# Patient Record
Sex: Male | Born: 1970
Health system: Southern US, Community
[De-identification: ages and names within clinical notes are randomized; demographics above are authoritative.]

## PROBLEM LIST (undated history)

## (undated) DIAGNOSIS — K219 Gastro-esophageal reflux disease without esophagitis: Secondary | ICD-10-CM

## (undated) DIAGNOSIS — R011 Cardiac murmur, unspecified: Secondary | ICD-10-CM

## (undated) DIAGNOSIS — F1020 Alcohol dependence, uncomplicated: Secondary | ICD-10-CM

## (undated) DIAGNOSIS — T7840XA Allergy, unspecified, initial encounter: Secondary | ICD-10-CM

## (undated) HISTORY — DX: Cardiac murmur, unspecified: R01.1

## (undated) HISTORY — DX: Alcohol dependence, uncomplicated: F10.20

## (undated) HISTORY — DX: Allergy, unspecified, initial encounter: T78.40XA

## (undated) HISTORY — DX: Gastro-esophageal reflux disease without esophagitis: K21.9

---

## 2011-11-11 ENCOUNTER — Ambulatory Visit: Payer: Worker's Compensation

## 2011-11-13 ENCOUNTER — Ambulatory Visit: Payer: Worker's Compensation

## 2013-12-05 ENCOUNTER — Ambulatory Visit: Payer: Self-pay

## 2013-12-05 ENCOUNTER — Ambulatory Visit: Payer: Self-pay | Admitting: Family Medicine

## 2013-12-05 VITALS — BP 110/70 | HR 74 | Temp 97.6°F | Resp 16 | Ht 70.0 in | Wt 212.0 lb

## 2013-12-05 DIAGNOSIS — M549 Dorsalgia, unspecified: Secondary | ICD-10-CM

## 2013-12-05 DIAGNOSIS — F17200 Nicotine dependence, unspecified, uncomplicated: Secondary | ICD-10-CM

## 2013-12-05 DIAGNOSIS — IMO0002 Reserved for concepts with insufficient information to code with codable children: Secondary | ICD-10-CM

## 2013-12-05 DIAGNOSIS — Z720 Tobacco use: Secondary | ICD-10-CM

## 2013-12-05 MED ORDER — VARENICLINE TARTRATE 0.5 MG X 11 & 1 MG X 42 PO MISC: ORAL | Status: DC

## 2013-12-05 MED ORDER — MELOXICAM 15 MG PO TABS: 15.0000 mg | ORAL_TABLET | Freq: Every day | ORAL | Status: DC

## 2013-12-05 MED ORDER — VARENICLINE TARTRATE 1 MG PO TABS: 1.0000 mg | ORAL_TABLET | Freq: Two times a day (BID) | ORAL | Status: DC

## 2013-12-05 MED ORDER — CYCLOBENZAPRINE HCL 10 MG PO TABS: ORAL_TABLET | ORAL | Status: DC

## 2013-12-05 MED ORDER — OXYCODONE-ACETAMINOPHEN 5-325 MG PO TABS: 1.0000 | ORAL_TABLET | Freq: Three times a day (TID) | ORAL | Status: DC | PRN

## 2013-12-05 NOTE — Progress Notes (Signed)
Chief Complaint:  Chief Complaint  Patient presents with  . Back Pain    x 3 days    HPI: Larry Taylor is a 43 y.o. male who is here for  Back pain x 3 days.  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 He has had back pain in the past. His original back pain started years ago when he had back pain after moving a refrigerator.  This particular episode may have started about 1 week ago at work after he picked up a bucket. He thought it was better but in the last 3 days he has had worsening sxs. He  got out of bathtub and bent over to brush his teeth and the pain started.  2 weeks prior to the lifting of the bucket at work he was fine, did not have back pain. He had been helping his mom, lifting her and cleaning her and his back was fine, She passed but he states that he did not have back pain when he was helping her.   His back pain is described as a constant aching pain, any movement produces sharp pain radiating down his mid back and then he would have aching down his leg but shooting pain goes up to back as well. He has to be in a bent position or his back has to be flexed to get some relief. 10/10 pain , worse back pain he has ever had. He took goody powder, back and body, ibuprofen and bayer without relief. He denies numbness,tingling, incontinence. He has some groin pain abut he is able to go to the bathroom normally. He does not have insurance through his work, they cut benefits after [employer] came into play but he never had it before either, he is thinking of going on his wife's insurance. He does not currently want to file this under work comp because the facility has [employer] work comp cases in the last few weeks.    He would liek help with his tobacco addition. He lost both parents to lung cancer.  Has tried before but not successful. He has been smoking 1 ppd x 27 years.   History reviewed. No pertinent past medical  history. History reviewed. No pertinent past surgical history. History   Social History  . Marital Status: Single    Spouse Name: N/A    Number of Children: N/A  . Years of Education: N/A   Social History Main Topics  . Smoking status: Current Every Day Smoker  . Smokeless tobacco: None  . Alcohol Use: None  . Drug Use: None  . Sexual Activity: None   Other Topics Concern  . None   Social History Narrative  . None   Family History  Problem Relation Age of Onset  . Cancer Mother   . Cancer Father   . Heart disease Father   . Stroke Father   . High Cholesterol Father   . Cancer Sister   . Cancer Brother    No Known Allergies Prior to Admission medications   Not on File     ROS: The patient denies fevers, chills, night sweats, unintentional weight loss, chest pain, palpitations, wheezing, dyspnea on exertion, nausea, vomiting, abdominal pain, dysuria, hematuria, melena, numbness, weakness, or tingling.   All other systems have been reviewed and were otherwise negative with the exception of those mentioned in the HPI and as above.  PHYSICAL EXAM: Filed Vitals:   12/05/13 1152  BP: 110/70  Pulse: 74  Temp: 97.6 F (36.4 C)  Resp: 16   Filed Vitals:   12/05/13 1152  Height: 5\' 10"  (1.778 m)  Weight: 212 lb (96.163 kg)   Body mass index is 30.42 kg/(m^2).  General: Alert, no acute distress HEENT:  Normocephalic, atraumatic, oropharynx patent. EOMI, PERRLA Cardiovascular:  Regular rate and rhythm, no rubs murmurs or gallops.  No Carotid bruits, radial pulse intact. No pedal edema.  Respiratory: Clear to auscultation bilaterally.  No wheezes, rales, or rhonchi.  No cyanosis, no use of accessory musculature GI: No organomegaly, abdomen is soft and non-tender, positive bowel sounds.  No masses. Skin: No rashes. Neurologic: Facial musculature symmetric. Psychiatric: Patient is appropriate throughout our interaction. Lymphatic: No cervical  lymphadenopathy Musculoskeletal: Gait intact. + paramsk tenderness  Decrease ROM d/t pain 5/5 strength, 2/2 DTRs No saddle anesthesia Straight leg positive Hip and knee exam--normal    LABS: No results found for this or any previous visit.   EKG/XRAY:   [facility]. No fx or dislocation   ASSESSMENT/PLAN: Encounter Diagnoses  Name Primary?  . Back pain Yes  . Sprain and strain of back   . Tobacco abuse    F/u on Thursday Rx flexeril, mobic, percocet, ROM exercises, modify activities. Narcotic profile pulled-no e/o abuse.  Rx chantix  Starter and refills for 3 months  Gross sideeffects, risk and benefits, and alternatives of medications d/w patient. Patient is aware that all medications have potential sideeffects and we are unable to predict every sideeffect or drug-drug interaction that may occur.  Dearion Huot PHUONG, DO 12/05/2013 1:19 PM

## 2013-12-05 NOTE — Patient Instructions (Signed)
Back Pain, Adult Low back pain is very common. About 1 in 5 people have back pain.The cause of low back pain is rarely dangerous. The pain often gets better over time.About half of people with a sudden onset of back pain feel better in just 2 weeks. About 8 in 10 people feel better by 6 weeks.  CAUSES Some common causes of back pain include:  Strain of the muscles or ligaments supporting the spine.  Wear and tear (degeneration) of the spinal discs.  Arthritis.  Direct injury to the back. DIAGNOSIS Most of the time, the direct cause of low back pain is not known.However, back pain can be treated effectively even when the exact cause of the pain is unknown.Answering your caregiver's questions about your overall health and symptoms is one of the most accurate ways to make sure the cause of your pain is not dangerous. If your caregiver needs more information, he or she may order lab work or imaging tests (X-rays or MRIs).However, even if imaging tests show changes in your back, this usually does not require surgery. HOME CARE INSTRUCTIONS For many people, back pain returns.Since low back pain is rarely dangerous, it is often a condition that people can learn to manageon their own.   Remain active. It is stressful on the back to sit or stand in one place. Do not sit, drive, or stand in one place for more than 30 minutes at a time. Take short walks on level surfaces as soon as pain allows.Try to increase the length of time you walk each day.  Do not stay in bed.Resting more than 1 or 2 days can delay your recovery.  Do not avoid exercise or work.Your body is made to move.It is not dangerous to be active, even though your back may hurt.Your back will likely heal faster if you return to being active before your pain is gone.  Pay attention to your body when you bend and lift. Many people have less discomfortwhen lifting if they bend their knees, keep the load close to their bodies,and  avoid twisting. Often, the most comfortable positions are those that put less stress on your recovering back.  Find a comfortable position to sleep. Use a firm mattress and lie on your side with your knees slightly bent. If you lie on your back, put a pillow under your knees.  Only take over-the-counter or prescription medicines as directed by your caregiver. Over-the-counter medicines to reduce pain and inflammation are often the most helpful.Your caregiver may prescribe muscle relaxant drugs.These medicines help dull your pain so you can more quickly return to your normal activities and healthy exercise.  Put ice on the injured area.  Put ice in a plastic bag.  Place a towel between your skin and the bag.  Leave the ice on for 15-20 minutes, 03-04 times a day for the first 2 to 3 days. After that, ice and heat may be alternated to reduce pain and spasms.  Ask your caregiver about trying back exercises and gentle massage. This may be of some benefit.  Avoid feeling anxious or stressed.Stress increases muscle tension and can worsen back pain.It is important to recognize when you are anxious or stressed and learn ways to manage it.Exercise is a great option. SEEK MEDICAL CARE IF:  You have pain that is not relieved with rest or medicine.  You have pain that does not improve in 1 week.  You have new symptoms.  You are generally not feeling well. SEEK   IMMEDIATE MEDICAL CARE IF:   You have pain that radiates from your back into your legs.  You develop new bowel or bladder control problems.  You have unusual weakness or numbness in your arms or legs.  You develop nausea or vomiting.  You develop abdominal pain.  You feel faint. Document Released: 10/13/2005 Document Revised: 04/13/2012 Document Reviewed: 03/03/2011 Exit[employer] Patient Information 2014 [employer].  

## 2013-12-08 ENCOUNTER — Ambulatory Visit (INDEPENDENT_AMBULATORY_CARE_PROVIDER_SITE_OTHER): Payer: Self-pay | Admitting: Family Medicine

## 2013-12-08 VITALS — BP 120/78 | HR 83 | Temp 97.6°F | Resp 16 | Ht 68.5 in | Wt 214.0 lb

## 2013-12-08 DIAGNOSIS — M549 Dorsalgia, unspecified: Secondary | ICD-10-CM

## 2013-12-08 NOTE — Progress Notes (Signed)
Chief Complaint:  Chief Complaint  Patient presents with  . Follow-up    Back pain    HPI: Larry Taylor is a 43 y.o. male who is here for  Recheck of his back pain. He is doing better but not 100%. He is 85% better. He still has a little twinge and msk catching when he bends over.  He is able to lift his leg but still has pain. No numbness or tingling. He is able to clean without problems but he is afraid to lift heavy items or objects. HE has not really taken the pain meds, tried the msk relaxer and that made his sleepy so he stopped. HE ahs been taking the mobic and moving around so that helps. HE feels achey when he sits for long periods and then gets up, long periods being 30 in at a time.   History reviewed. No pertinent past medical history. History reviewed. No pertinent past surgical history. History   Social History  . Marital Status: Single    Spouse Name: N/A    Number of Children: N/A  . Years of Education: N/A   Social History Main Topics  . Smoking status: Current Every Day Smoker  . Smokeless tobacco: None  . Alcohol Use: None  . Drug Use: None  . Sexual Activity: None   Other Topics Concern  . None   Social History Narrative  . None   Family History  Problem Relation Age of Onset  . Cancer Mother   . Cancer Father   . Heart disease Father   . Stroke Father   . High Cholesterol Father   . Cancer Sister   . Cancer Brother    No Known Allergies Prior to Admission medications   Medication Sig Start Date End Date Taking? Authorizing Provider  cyclobenzaprine (FLEXERIL) 10 MG tablet 1/2 to 1 tab po twice a day as needed for msk spasms. Do not drive,makes you  drowsy 1/6/102/9/15  Yes Vickye Astorino P Makeisha Jentsch, DO  meloxicam (MOBIC) 15 MG tablet Take 1 tablet (15 mg total) by mouth daily. No NSAIDs, take with food. 12/05/13  Yes Maythe Deramo P Maizey Menendez, DO  oxyCODONE-acetaminophen (ROXICET) 5-325 MG per tablet Take 1 tablet by mouth every 8 (eight) hours as needed for severe pain.  12/05/13  Yes Mathew Storck P Briana Farner, DO  varenicline (CHANTIX STARTING MONTH PAK) 0.5 MG X 11 & 1 MG X 42 tablet Take one 0.5 mg tablet by mouth once daily for 3 days, then increase to one 0.5 mg tablet twice daily for 4 days, then increase to one 1 mg tablet twice daily. 12/05/13  Yes Ayslin Kundert P Caitlynn Ju, DO  varenicline (CHANTIX) 1 MG tablet Take 1 tablet (1 mg total) by mouth 2 (two) times daily. 12/05/13  Yes Verland Sprinkle P Ebbie Cherry, DO     ROS: The patient denies fevers, chills, night sweats, unintentional weight loss, chest pain, palpitations, wheezing, dyspnea on exertion, nausea, vomiting, abdominal pain, dysuria, hematuria, melena, numbness, weakness, or tingling.  All other systems have been reviewed and were otherwise negative with the exception of those mentioned in the HPI and as above.    PHYSICAL EXAM: Filed Vitals:   12/08/13 1937  BP: 120/78  Pulse: 83  Temp: 97.6 F (36.4 C)  Resp: 16   Filed Vitals:   12/08/13 1937  Height: 5' 8.5" (1.74 m)  Weight: 214 lb (97.07 kg)   Body mass index is 32.06 kg/(m^2).  General: Alert, no acute distress  HEENT:  Normocephalic, atraumatic, oropharynx patent. EOMI, PERRLA Cardiovascular:  Regular rate and rhythm, no rubs murmurs or gallops.  No Carotid bruits, radial pulse intact. No pedal edema.  Respiratory: Clear to auscultation bilaterally.  No wheezes, rales, or rhonchi.  No cyanosis, no use of accessory musculature GI: No organomegaly, abdomen is soft and non-tender, positive bowel sounds.  No masses. Skin: No rashes. Neurologic: Facial musculature symmetric. Psychiatric: Patient is appropriate throughout our interaction. Lymphatic: No cervical lymphadenopathy Musculoskeletal: Gait intact.   LABS: No results found for this or any previous visit.   EKG/XRAY:   Primary read interpreted by Dr. Conley Rolls at [facility].   ASSESSMENT/PLAN: Encounter Diagnosis  Name Primary?  . Back pain Yes   Improved Return to work on Monday Continue with Mobic, may take other meds  at 1/2 dose prn F/u as needed  Gross sideeffects, risk and benefits, and alternatives of medications d/w patient. Patient is aware that all medications have potential sideeffects and we are unable to predict every sideeffect or drug-drug interaction that may occur.  Hamilton Capri PHUONG, DO 12/08/2013 8:05 PM

## 2014-01-06 ENCOUNTER — Encounter: Payer: Self-pay | Admitting: Family Medicine

## 2014-01-06 ENCOUNTER — Ambulatory Visit: Payer: Self-pay | Admitting: Family Medicine

## 2014-01-06 VITALS — BP 121/78 | HR 68 | Temp 98.5°F | Resp 16 | Ht 69.5 in | Wt 210.0 lb

## 2014-01-06 DIAGNOSIS — Z13220 Encounter for screening for lipoid disorders: Secondary | ICD-10-CM

## 2014-01-06 DIAGNOSIS — Z131 Encounter for screening for diabetes mellitus: Secondary | ICD-10-CM

## 2014-01-06 DIAGNOSIS — Z720 Tobacco use: Secondary | ICD-10-CM

## 2014-01-06 DIAGNOSIS — F17200 Nicotine dependence, unspecified, uncomplicated: Secondary | ICD-10-CM

## 2014-01-06 LAB — LIPID PANEL
Cholesterol: 228 mg/dL — ABNORMAL HIGH (ref 0–200)
HDL: 42 mg/dL (ref >39)
LDL Cholesterol: 168 mg/dL — ABNORMAL HIGH (ref 0–99)
Total CHOL/HDL Ratio: 5.4 Ratio
Triglycerides: 89 mg/dL (ref <150)
VLDL: 18 mg/dL (ref 0–40)

## 2014-01-06 LAB — COMPREHENSIVE METABOLIC PANEL
ALT: 49 U/L (ref 0–53)
Albumin: 4.5 g/dL (ref 3.5–5.2)
BUN: 12 mg/dL (ref 6–23)
CO2: 27 mEq/L (ref 19–32)
Calcium: 10.3 mg/dL (ref 8.4–10.5)
Chloride: 101 mEq/L (ref 96–112)
Creat: 1.06 mg/dL (ref 0.50–1.35)
Potassium: 4.5 mEq/L (ref 3.5–5.3)

## 2014-01-06 LAB — CBC
HCT: 48.2 % (ref 39.0–52.0)
Hemoglobin: 16.9 g/dL (ref 13.0–17.0)
MCH: 31 pg (ref 26.0–34.0)
MCHC: 35.1 g/dL (ref 30.0–36.0)
MCV: 88.3 fL (ref 78.0–100.0)
Platelets: 239 10*3/uL (ref 150–400)
RBC: 5.46 MIL/uL (ref 4.22–5.81)
RDW: 14 % (ref 11.5–15.5)
WBC: 8.7 10*3/uL (ref 4.0–10.5)

## 2014-01-06 LAB — COMPREHENSIVE METABOLIC PANEL WITH GFR
AST: 29 U/L (ref 0–37)
Alkaline Phosphatase: 76 U/L (ref 39–117)
Glucose, Bld: 82 mg/dL (ref 70–99)
Sodium: 137 meq/L (ref 135–145)
Total Bilirubin: 0.5 mg/dL (ref 0.2–1.2)
Total Protein: 7.2 g/dL (ref 6.0–8.3)

## 2014-01-06 NOTE — Progress Notes (Signed)
Chief Complaint:  Chief Complaint  Patient presents with  . Annual Exam    HPI: Larry Taylor is a 43 y.o. male who is here for  Annual visit. He is doing well. He has no complaints.  Would like to get screening hyperlipidemia. He has a chronic smoker's cough.  He has not picked up Chantix. Strong family history of lung cancer, he is a smoker.  His back pain is better.    Past Medical History  Diagnosis Date  . Heart murmur     per patient diagnosed at 43 years old   No past surgical history on file. History   Social History  . Marital Status: Single    Spouse Name: N/A    Number of Children: N/A  . Years of Education: N/A   Occupational History  . 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 Smoking status: Current Every Day Smoker -- 1.00 packs/day for 27 years    Types: Cigarettes  . Smokeless tobacco: None  . Alcohol Use: None  . Drug Use: None  . Sexual Activity: None   Other Topics Concern  . None   Social History Narrative  . None   Family History  Problem Relation Age of Onset  . Cancer Mother   . Hyperlipidemia Mother   . Cancer Father   . Heart disease Father   . Stroke Father   . High Cholesterol Father   . Cancer Sister   . Cancer Brother    No Known Allergies Prior to Admission medications   Medication Sig Start Date End Date Taking? Authorizing Provider  cyclobenzaprine (FLEXERIL) 10 MG tablet 1/2 to 1 tab po twice a day as needed for msk spasms. Do not drive,makes you  drowsy 1/6/102/9/15   Denicia Pagliarulo P Kerrin Markman, DO  meloxicam (MOBIC) 15 MG tablet Take 1 tablet (15 mg total) by mouth daily. No NSAIDs, take with food. 12/05/13   Corrin Sieling P Kauan Kloosterman, DO  oxyCODONE-acetaminophen (ROXICET) 5-325 MG per tablet Take 1 tablet by mouth every 8 (eight) hours as needed for severe pain. 12/05/13   Shalamar Plourde P Verland Sprinkle, DO  varenicline (CHANTIX STARTING MONTH PAK) 0.5 MG X 11 & 1 MG X 42 tablet Take one 0.5 mg tablet by mouth once daily for 3 days, then increase  to one 0.5 mg tablet twice daily for 4 days, then increase to one 1 mg tablet twice daily. 12/05/13   Azavier Creson P Wolfgang Finigan, DO  varenicline (CHANTIX) 1 MG tablet Take 1 tablet (1 mg total) by mouth 2 (two) times daily. 12/05/13   Kili Gracy P Bayyinah Dukeman, DO     ROS: The patient denies fevers, chills, night sweats, unintentional weight loss, chest pain, palpitations, wheezing, dyspnea on exertion, nausea, vomiting, abdominal pain, dysuria, hematuria, melena, numbness, weakness, or tingling.   All other systems have been reviewed and were otherwise negative with the exception of those mentioned in the HPI and as above.    PHYSICAL EXAM: Filed Vitals:   01/06/14 1539  BP: 121/78  Pulse: 68  Temp: 98.5 F (36.9 C)  Resp: 16   Filed Vitals:   01/06/14 1539  Height: 5' 9.5" (1.765 m)  Weight: 210 lb (95.255 kg)   Body mass index is 30.58 kg/(m^2).  General: Alert, no acute distress HEENT:  Normocephalic, atraumatic, oropharynx patent. EOMI, PERRLA Cardiovascular:  Regular rate and rhythm, no rubs murmurs or gallops.  No Carotid bruits, radial pulse intact. No pedal edema.  Respiratory: Clear to auscultation bilaterally.  No wheezes, rales, or rhonchi.  No cyanosis, no use of accessory musculature GI: No organomegaly, abdomen is soft and non-tender, positive bowel sounds.  No masses. Skin: No rashes. Neurologic: Facial musculature symmetric. Psychiatric: Patient is appropriate throughout our interaction. Lymphatic: No cervical lymphadenopathy Musculoskeletal: Gait intact. Normal genital exam, normal hernia exam   LABS: Results for orders placed in visit on 01/06/14  CBC      Result Value Ref Range   WBC 8.7  4.0 - 10.5 K/uL   RBC 5.46  4.22 - 5.81 MIL/uL   Hemoglobin 16.9  13.0 - 17.0 g/dL   HCT 16.1  09.6 - 04.5 %   MCV 88.3  78.0 - 100.0 fL   MCH 31.0  26.0 - 34.0 pg   MCHC 35.1  30.0 - 36.0 g/dL   RDW 40.9  81.1 - 91.4 %   Platelets 239  150 - 400 K/uL  COMPREHENSIVE METABOLIC PANEL      Result  Value Ref Range   Sodium 137  135 - 145 mEq/L   Potassium 4.5  3.5 - 5.3 mEq/L   Chloride 101  96 - 112 mEq/L   CO2 27  19 - 32 mEq/L   Glucose, Bld 82  70 - 99 mg/dL   BUN 12  6 - 23 mg/dL   Creat 7.82  9.56 - 2.13 mg/dL   Total Bilirubin 0.5  0.2 - 1.2 mg/dL   Alkaline Phosphatase 76  39 - 117 U/L   AST 29  0 - 37 U/L   ALT 49  0 - 53 U/L   Total Protein 7.2  6.0 - 8.3 g/dL   Albumin 4.5  3.5 - 5.2 g/dL   Calcium 08.6  8.4 - 57.8 mg/dL  LIPID PANEL      Result Value Ref Range   Cholesterol 228 (*) 0 - 200 mg/dL   Triglycerides 89  <469 mg/dL   HDL 42  >62 mg/dL   Total CHOL/HDL Ratio 5.4     VLDL 18  0 - 40 mg/dL   LDL Cholesterol 952 (*) 0 - 99 mg/dL     EKG/XRAY:   Primary read interpreted by Dr. Conley Rolls at [facility].   ASSESSMENT/PLAN: Encounter Diagnoses  Name Primary?  . Screening for diabetes mellitus Yes  . Screening for hyperlipidemia   . Tobacco use    Labs pending: CMP, CBC, lipid F/u as needed  Gross sideeffects, risk and benefits, and alternatives of medications d/w patient. Patient is aware that all medications have potential sideeffects and we are unable to predict every sideeffect or drug-drug interaction that may occur.  Hamilton Capri PHUONG, DO 01/11/2014 8:47 AM

## 2014-01-11 ENCOUNTER — Encounter: Payer: Self-pay | Admitting: Family Medicine

## 2014-01-19 ENCOUNTER — Telehealth: Payer: Self-pay

## 2014-01-19 NOTE — Telephone Encounter (Signed)
Patient called stated Larry Taylor seen him for his physical two weeks ago and stated he requested Larry Taylor to be his primary care doctor. [name] stated he also will like for Larry Taylor to be his wife primary care doctor also.

## 2014-02-06 ENCOUNTER — Encounter: Payer: Self-pay | Admitting: Family Medicine

## 2014-10-10 ENCOUNTER — Encounter: Payer: Self-pay | Admitting: Family Medicine

## 2016-09-01 ENCOUNTER — Ambulatory Visit (INDEPENDENT_AMBULATORY_CARE_PROVIDER_SITE_OTHER): Payer: Managed Care, Other (non HMO) | Admitting: Physician Assistant

## 2016-09-01 VITALS — BP 124/80 | HR 65 | Temp 98.2°F | Resp 17 | Ht 69.5 in | Wt 216.0 lb

## 2016-09-01 DIAGNOSIS — R6882 Decreased libido: Secondary | ICD-10-CM

## 2016-09-01 DIAGNOSIS — K649 Unspecified hemorrhoids: Secondary | ICD-10-CM | POA: Diagnosis not present

## 2016-09-01 DIAGNOSIS — Z0000 Encounter for general adult medical examination without abnormal findings: Secondary | ICD-10-CM

## 2016-09-01 DIAGNOSIS — F17200 Nicotine dependence, unspecified, uncomplicated: Secondary | ICD-10-CM | POA: Diagnosis not present

## 2016-09-01 DIAGNOSIS — Z716 Tobacco abuse counseling: Secondary | ICD-10-CM

## 2016-09-01 DIAGNOSIS — Z130 Encounter for screening for diseases of the blood and blood-forming organs and certain disorders involving the immune mechanism: Secondary | ICD-10-CM | POA: Diagnosis not present

## 2016-09-01 DIAGNOSIS — Z13228 Encounter for screening for other metabolic disorders: Secondary | ICD-10-CM

## 2016-09-01 DIAGNOSIS — R5383 Other fatigue: Secondary | ICD-10-CM | POA: Diagnosis not present

## 2016-09-01 DIAGNOSIS — R9431 Abnormal electrocardiogram [ECG] [EKG]: Secondary | ICD-10-CM | POA: Diagnosis not present

## 2016-09-01 DIAGNOSIS — Z720 Tobacco use: Secondary | ICD-10-CM

## 2016-09-01 DIAGNOSIS — Z13220 Encounter for screening for lipoid disorders: Secondary | ICD-10-CM

## 2016-09-01 LAB — CBC WITH DIFFERENTIAL/PLATELET
BASOS ABS: 0 {cells}/uL (ref 0–200)
Basophils Relative: 0 %
EOS ABS: 105 {cells}/uL (ref 15–500)
Eosinophils Relative: 1 %
HEMATOCRIT: 49.8 % (ref 38.5–50.0)
Hemoglobin: 17.1 g/dL (ref 13.2–17.1)
LYMPHS PCT: 27 %
Lymphs Abs: 2835 cells/uL (ref 850–3900)
MCH: 31.5 pg (ref 27.0–33.0)
MCHC: 34.3 g/dL (ref 32.0–36.0)
MCV: 91.7 fL (ref 80.0–100.0)
MONO ABS: 525 {cells}/uL (ref 200–950)
MPV: 10.8 fL (ref 7.5–12.5)
Monocytes Relative: 5 %
Neutro Abs: 7035 cells/uL (ref 1500–7800)
Neutrophils Relative %: 67 %
Platelets: 228 10*3/uL (ref 140–400)
RBC: 5.43 MIL/uL (ref 4.20–5.80)
RDW: 13.4 % (ref 11.0–15.0)
WBC: 10.5 10*3/uL (ref 3.8–10.8)

## 2016-09-01 LAB — LIPID PANEL
CHOLESTEROL: 212 mg/dL — AB (ref <200)
HDL: 45 mg/dL (ref >40)
LDL Cholesterol: 151 mg/dL — ABNORMAL HIGH
TRIGLYCERIDES: 78 mg/dL (ref <150)
Total CHOL/HDL Ratio: 4.7 Ratio (ref <5.0)
VLDL: 16 mg/dL (ref <30)

## 2016-09-01 LAB — COMPLETE METABOLIC PANEL WITH GFR
ALT: 30 U/L (ref 9–46)
AST: 22 U/L (ref 10–40)
Albumin: 4.6 g/dL (ref 3.6–5.1)
Alkaline Phosphatase: 64 U/L (ref 40–115)
BUN: 18 mg/dL (ref 7–25)
CALCIUM: 10.1 mg/dL (ref 8.6–10.3)
CO2: 23 mmol/L (ref 20–31)
CREATININE: 0.9 mg/dL (ref 0.60–1.35)
Chloride: 105 mmol/L (ref 98–110)
GFR, Est Non African American: >89 mL/min (ref >=60)
Glucose, Bld: 91 mg/dL (ref 65–99)
Potassium: 4.7 mmol/L (ref 3.5–5.3)
Sodium: 138 mmol/L (ref 135–146)
TOTAL PROTEIN: 7.1 g/dL (ref 6.1–8.1)
Total Bilirubin: 0.4 mg/dL (ref 0.2–1.2)

## 2016-09-01 LAB — TSH: TSH: 1.01 mIU/L (ref 0.40–4.50)

## 2016-09-01 MED ORDER — VARENICLINE TARTRATE 0.5 MG X 11 & 1 MG X 42 PO MISC: ORAL | 0 refills | Status: DC

## 2016-09-01 MED ORDER — VARENICLINE TARTRATE 1 MG PO TABS: 1.0000 mg | ORAL_TABLET | Freq: Two times a day (BID) | ORAL | 2 refills | Status: DC

## 2016-09-01 NOTE — Progress Notes (Signed)
Larry Taylor  MRN: [id number] DOB: 31-Dec-1970  Subjective:  Pt is a 45 y.o. male who presents for annual physical exam.   Diet: He eats whole grain foods and has cut out most sugars. He likes almonds, fish, chicken, pb and j sandwiches. He drinks water and 2% milk.   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 He does not do any structured exercise daily.   His concerns today:  1) Low energy level: Notes that for the past year, he feels more fatigued. He needs at least one nap a day. He gets about 7-8 hours of sleep a night (goes to bed at 10p and gets up 6a), notes he sleeps throughout the entire night. He feels good in the morning but after a few hours he feels as if he needs a nap. He drinks about three cups of coffee in the morning.    2) Wants to discuss getting hemorrhoids removed: He has hemorrhoids for 22 years. In 2005, he had them drained. He has noticed bright red blood in his stool these past two weeks except for yesterday and today. He does not have any pain during bowel movements. He has one bowel movement in the morning and notes he does not have to strain.    3) He would like a prescription for viagra: Pt notes that he has been having erectile dysfunction for the past two years. He is sexaully active with his wife but notes that he is having a hard time becoming aroused. He also has difficulty obtaining and maintaining an erection. He notes he has not had a morning erection in over 1.5 years. He has never tried medication for this issue in the past. He denies any exertional chest pain or family history of MI's. His father did have CHF.   4) Would also like a refill for chantix. He never picked up the prescription in 2015. Pt is an every day smoker but is ready to quit. Both of his parents died from lung cancer.   Last annual exam: 01/06/2014 Last dental exam: 2015 Last vision exam: Never Last EKG: 2011, normal    Vaccinations      Tetanus: 10/10/2014    Patient is fasting today.   There are no active problems to display for this patient.   Current Outpatient Prescriptions on File Prior to Visit  Medication Sig Dispense Refill  . varenicline (CHANTIX STARTING MONTH PAK) 0.5 MG X 11 & 1 MG X 42 tablet Take one 0.5 mg tablet by mouth once daily for 3 days, then increase to one 0.5 mg tablet twice daily for 4 days, then increase to one 1 mg tablet twice daily. (Patient not taking: Reported on 09/01/2016) 53 tablet 0  . varenicline (CHANTIX) 1 MG tablet Take 1 tablet (1 mg total) by mouth 2 (two) times daily. (Patient not taking: Reported on 09/01/2016) 60 tablet 2   No current facility-administered medications on file prior to visit.     No Known Allergies  Social History   Social History  . Marital status: Single    Spouse name: N/A  . Number of children: N/A  . Years of education: N/A   Occupational History  . environmental services director [employer]   Social History Main Topics  . Smoking status: Current Every Day Smoker    Packs/day: 1.00    Years: 27.00    Types: Cigarettes  . Smokeless tobacco: None  .  Alcohol use None  . Drug use: Unknown  . Sexual activity: Not Asked   Other Topics Concern  . None   Social History Narrative  . None    No past surgical history on file.  Family History  Problem Relation Age of Onset  . Cancer Mother   . Hyperlipidemia Mother   . Cancer Father   . Heart disease Father   . Stroke Father   . High Cholesterol Father   . Cancer Sister   . Cancer Brother     Review of Systems  Constitutional: Negative.   HENT: Negative.   Eyes: Negative.   Respiratory: Positive for cough ( especially in the morning, notes he has had this for years due to smoking), shortness of breath and wheezing. Negative for apnea, choking, chest tightness and stridor.   Cardiovascular: Negative.   Gastrointestinal: Positive for anal  bleeding and blood in stool. Negative for abdominal distention, abdominal pain, constipation, diarrhea, nausea, rectal pain and vomiting.  Endocrine: Negative.   Genitourinary: Negative.   Skin: Negative.   Allergic/Immunologic: Negative.   Neurological: Negative.   Hematological: Negative.   Psychiatric/Behavioral: Negative.     Objective:  BP 124/80 (BP Location: Right Arm, Patient Position: Sitting, Cuff Size: Normal)   Pulse 65   Temp 98.2 F (36.8 C) (Oral)   Resp 17   Ht 5' 9.5" (1.765 m)   Wt 216 lb (98 kg)   SpO2 96%   BMI 31.44 kg/m   Physical Exam  Constitutional: He is oriented to person, place, and time and well-developed, well-nourished, and in no distress.  HENT:  Head: Normocephalic and atraumatic.  Right Ear: Hearing, tympanic membrane, external ear and ear canal normal.  Left Ear: Hearing, tympanic membrane, external ear and ear canal normal.  Nose: Septal deviation present.  Mouth/Throat: Uvula is midline, oropharynx is clear and moist and mucous membranes are normal. No oropharyngeal exudate.  Eyes: Conjunctivae and EOM are normal. Pupils are equal, round, and reactive to light.  Neck: Trachea normal and normal range of motion.  Cardiovascular: Normal rate, regular rhythm, normal heart sounds and intact distal pulses.   Pulmonary/Chest: Effort normal and breath sounds normal.  Abdominal: Soft. Normal appearance and bowel sounds are normal.  Genitourinary: Rectal exam shows external hemorrhoid (non tender). Rectal exam shows no tenderness.  Musculoskeletal: Normal range of motion.  Lymphadenopathy:       Head (right side): No submental, no submandibular, no tonsillar, no preauricular, no posterior auricular and no occipital adenopathy present.       Head (left side): No submental, no submandibular, no tonsillar, no preauricular, no posterior auricular and no occipital adenopathy present.    He has no cervical adenopathy.       Right: No supraclavicular  adenopathy present.       Left: No supraclavicular adenopathy present.  Neurological: He is alert and oriented to person, place, and time. He has normal sensation, normal strength and normal reflexes. Gait normal.  Skin: Skin is warm and dry.  Psychiatric: Affect normal.  Vitals reviewed.    Visual Acuity Screening   Right eye Left eye Both eyes  Without correction: 20/20 20/25 20/20   With correction:       EKG shows sinus bradycardia at 51 bpm with inferolateral ST elevation, likely repolarization variant. Findings presented to Dr. Neva SeatGreene. No prior EKG in our system for comparison.  Assessment and Plan :  Discussed healthy lifestyle, diet, exercise, preventative care, vaccinations, and addressed patient's concerns. Plan  for follow up in one year. Otherwise, plan for specific conditions below.  1. Annual physical exam Await results  2. Screening, anemia, deficiency, iron - CBC with Differential/Platelet  3. Screening for metabolic disorder - COMPLETE METABOLIC PANEL WITH GFR  4. Screening, lipid - Lipid panel  5. Other fatigue - Testos,Total,Free and SHBG (Male) - TSH  6. Decreased sex drive - Testos,Total,Free and SHBG (Male)  7. Smoker - EKG 12-Lead  8. Nonspecific abnormal electrocardiogram (ECG) (EKG) -Due to EKG findings will refer to cardiology. Pt is not having chest pain or tightness today however since there is no EKG for comparison and considering he is 45 years old and a smoker, he will benefit from seeing cardiology. Will wait to prescribe viagra until after he has his cardiology appointment. He can call the office at this point and receive a prescription for viagra as long as he is cleared from cardiology.  - Ambulatory referral to Cardiology  9. Hemorrhoids, unspecified hemorrhoid type - Ambulatory referral to Gastroenterology  10. Encounter for smoking cessation counseling -Smoking cessation given.  - varenicline (CHANTIX STARTING MONTH PAK) 0.5 MG  X 11 & 1 MG X 42 tablet; Take one 0.5 mg tablet by mouth once daily for 3 days, then increase to one 0.5 mg tablet twice daily for 4 days, then increase to one 1 mg tablet twice daily.  Dispense: 53 tablet; Refill: 0   Benjiman Core PA-C  [facility] 09/01/2016 9:32 AM

## 2016-09-01 NOTE — Patient Instructions (Addendum)
  CHANTIX Instructions: Select a QUIT date at least 7 days in the future. You should be taking the Chantix for at least 7 days before you stop smoking. Days 1-4: take 1/2 tablet (0.5 mg) each evening Days 5-8: take 1/2 tablet (0.5 mg) each morning and each evening Days 9-12: take 1/2 tablet (0.5 mg) each morning and 1 tablet (1 mg) each evening Days 13-on: Take 1 tablet (1 mg) each morning and each evening Treatment duration is 12 weeks. May repeat again for additional 12 weeks.  If you run out of medication, call our office.  You will be hearing from both GI and cardiology for your future appointments. Thank you for letting me participate in your health and well being.    IF you received an x-ray today, you will receive an invoice from [facility]. Please contact [facility] at [phone] with questions or concerns regarding your invoice.   IF you received labwork today, you will receive an invoice from [facility]. Please contact [facility] at [phone] with questions or concerns regarding your invoice.   Our billing staff will not be able to assist you with questions regarding bills from these companies.  You will be contacted with the lab results as soon as they are available. The fastest way to get your results is to activate your My Chart account. Instructions are located on the last page of this paperwork. If you have not heard from us regarding the results in 2 weeks, please contact this office.

## 2016-09-16 ENCOUNTER — Encounter: Payer: Self-pay | Admitting: Cardiovascular Disease

## 2016-09-17 ENCOUNTER — Encounter: Payer: Self-pay | Admitting: Cardiovascular Disease

## 2016-09-24 LAB — TESTOS,TOTAL,FREE AND SHBG (FEMALE)
SEX HORMONE BINDING GLOB.: 40 nmol/L (ref 10–50)
Testosterone,Total,LC/MS/MS: 539 ng/dL (ref 250–1100)

## 2016-09-29 ENCOUNTER — Ambulatory Visit: Payer: Self-pay | Admitting: Cardiovascular Disease

## 2016-10-01 ENCOUNTER — Encounter: Payer: Self-pay | Admitting: Physician Assistant

## 2016-10-13 ENCOUNTER — Ambulatory Visit: Payer: Managed Care, Other (non HMO) | Admitting: Physician Assistant

## 2016-10-17 ENCOUNTER — Encounter: Payer: Self-pay | Admitting: Physician Assistant

## 2016-11-03 ENCOUNTER — Ambulatory Visit (INDEPENDENT_AMBULATORY_CARE_PROVIDER_SITE_OTHER): Payer: Managed Care, Other (non HMO) | Admitting: Cardiovascular Disease

## 2016-11-03 ENCOUNTER — Encounter: Payer: Self-pay | Admitting: Cardiovascular Disease

## 2016-11-03 VITALS — BP 118/80 | HR 68 | Ht 69.5 in | Wt 219.0 lb

## 2016-11-03 DIAGNOSIS — I4510 Unspecified right bundle-branch block: Secondary | ICD-10-CM | POA: Diagnosis not present

## 2016-11-03 NOTE — Patient Instructions (Signed)
Medication Instructions:  Your physician recommends that you continue on your current medications as directed. Please refer to the Current Medication list given to you today.   Labwork: None Ordered   Testing/Procedures: None Ordered   Follow-Up: Your physician recommends that you schedule a follow-up appointment in: as needed with Dr. Nahser   If you need a refill on your cardiac medications before your next appointment, please call your pharmacy.   Thank you for choosing [facility]! Abie Cheek, RN [phone]    

## 2016-11-03 NOTE — Progress Notes (Signed)
Cardiology Office Note   Date:  11/03/2016   ID:  Larry Taylor, DOB [birth year], MRN [id number]  PCP:  Magdalene River, PA-C  Cardiologist:   Kristeen Miss, MD   Chief Complaint  Patient presents with  . Abnormal ECG      History of Present Illness: Larry Taylor is a 46 y.o. male who presents for Further evaluation of an abnormal EKG.  He was at his medical doctor's office for regular physical. He was found have an incomplete right bundle-branch block.  He is fairly healthy but has no energy or inclination to do much of anything  Smokes 1 ppd for the past 30 years Has gained weight this year. Is fatigued  Every day .  Works for [employer].  No CP , no dyspnea.    Has some smoking related issues   Is a former drug abuser, crack, opoids, alcoholic.  Just cant  Seem to give up the cigarettes.    Past Medical History:  Diagnosis Date  . Heart murmur    per patient diagnosed at 46 years old    No past surgical history on file.   Current Outpatient Prescriptions  Medication Sig Dispense Refill  . varenicline (CHANTIX STARTING MONTH PAK) 0.5 MG X 11 & 1 MG X 42 tablet Take one 0.5 mg tablet by mouth once daily for 3 days, then increase to one 0.5 mg tablet twice daily for 4 days, then increase to one 1 mg tablet twice daily. (Patient not taking: Reported on 11/03/2016) 53 tablet 0  . varenicline (CHANTIX) 1 MG tablet Take 1 tablet (1 mg total) by mouth 2 (two) times daily. (Patient not taking: Reported on 11/03/2016) 60 tablet 2   No current facility-administered medications for this visit.     Allergies:   Patient has no known allergies.    Social History:  The patient  reports that he has been smoking Cigarettes.  He has a 30.00 pack-year smoking history. He has never used smokeless tobacco. He reports that he does not drink alcohol or use drugs.   Family History:  The patient's family history includes Cancer in his brother, father, mother, and sister; Heart  disease in his father; High Cholesterol in his father; Hyperlipidemia in his mother; Stroke in his father.    ROS:  Please see the history of present illness.    Review of Systems: Constitutional:  denies fever, chills, diaphoresis, appetite change and fatigue.  HEENT: denies photophobia, eye pain, redness, hearing loss, ear pain, congestion, sore throat, rhinorrhea, sneezing, neck pain, neck stiffness and tinnitus.  Respiratory: denies SOB, DOE, cough, chest tightness, and wheezing.  Cardiovascular: denies chest pain, palpitations and leg swelling.  Gastrointestinal: denies nausea, vomiting, abdominal pain, diarrhea, constipation, blood in stool.  Genitourinary: denies dysuria, urgency, frequency, hematuria, flank pain and difficulty urinating.  Musculoskeletal: denies  myalgias, back pain, joint swelling, arthralgias and gait problem.   Skin: denies pallor, rash and wound.  Neurological: denies dizziness, seizures, syncope, weakness, light-headedness, numbness and headaches.   Hematological: denies adenopathy, easy bruising, personal or family bleeding history.  Psychiatric/ Behavioral: denies suicidal ideation, mood changes, confusion, nervousness, sleep disturbance and agitation.       All other systems are reviewed and negative.    PHYSICAL EXAM: VS:  BP 118/80 (BP Location: Right Arm, Cuff Size: Normal)   Pulse 68   Ht 5' 9.5" (1.765 m)   Wt 219 lb (99.3 kg)   SpO2 97%   BMI  31.88 kg/m  , BMI Body mass index is 31.88 kg/m. GEN: Well nourished, well developed, in no acute distress  HEENT: normal  Neck: no JVD, carotid bruits, or masses Cardiac: RRR; no murmurs, rubs, or gallops,no edema  Respiratory:  clear to auscultation bilaterally, normal work of breathing GI: soft, nontender, nondistended, + BS MS: no deformity or atrophy  Skin: warm and dry, no rash Neuro:  Strength and sensation are intact Psych: normal   EKG:  EKG is ordered today. The ekg ordered today  demonstrates   NSR at 68.   Inc. RBBB.     Recent Labs: 09/01/2016: ALT 30; BUN 18; Creat 0.90; Hemoglobin 17.1; Platelets 228; Potassium 4.7; Sodium 138; TSH 1.01    Lipid Panel    Component Value Date/Time   CHOL 212 (H) 09/01/2016 1012   TRIG 78 09/01/2016 1012   HDL 45 09/01/2016 1012   CHOLHDL 4.7 09/01/2016 1012   VLDL 16 09/01/2016 1012   LDLCALC 151 (H) 09/01/2016 1012      Wt Readings from Last 3 Encounters:  11/03/16 219 lb (99.3 kg)  09/01/16 216 lb (98 kg)  01/06/14 210 lb (95.3 kg)      Other studies Reviewed: Additional studies/ records that were reviewed today include: . Review of the above records demonstrates:    ASSESSMENT AND PLAN:  1.  Incomplete RBBB:   This is a benign condition. He has no CP ,   No significant dyspnea  He presented to the doctors office to inquire about taking Viagra.   I think that he could safely take Sildenafil . I've suggested that he request Sildenafil  20 mg tablets   - [facility] [place] is the least expensive source for generic Sildenafil   I've advised him to stop smoking. I've advised him to start a regular exercise program. He has any problems with exercise then I've advised him to call me back. Otherwise I will see him on an as-needed basis.  Current medicines are reviewed at length with the patient today.  The patient does not have concerns regarding medicines.  Labs/ tests ordered today include:  No orders of the defined types were placed in this encounter.    Disposition:   FU with me as needed.      Kristeen MissPhilip D'Arcy Abraha, MD  11/03/2016 2:54 PM    [facility] [address], [place], [place]  [address] Phone: [phone]; Fax: [phone]

## 2017-04-20 ENCOUNTER — Ambulatory Visit (INDEPENDENT_AMBULATORY_CARE_PROVIDER_SITE_OTHER): Payer: Managed Care, Other (non HMO) | Admitting: Emergency Medicine

## 2017-04-20 ENCOUNTER — Encounter: Payer: Self-pay | Admitting: Emergency Medicine

## 2017-04-20 VITALS — BP 106/64 | HR 67 | Temp 98.8°F | Resp 16 | Ht 69.0 in | Wt 215.6 lb

## 2017-04-20 DIAGNOSIS — R5383 Other fatigue: Secondary | ICD-10-CM

## 2017-04-20 DIAGNOSIS — F17209 Nicotine dependence, unspecified, with unspecified nicotine-induced disorders: Secondary | ICD-10-CM | POA: Diagnosis not present

## 2017-04-20 DIAGNOSIS — Z716 Tobacco abuse counseling: Secondary | ICD-10-CM | POA: Diagnosis not present

## 2017-04-20 MED ORDER — BUPROPION HCL ER (SR) 150 MG PO TB12: 150.0000 mg | ORAL_TABLET | Freq: Two times a day (BID) | ORAL | 3 refills | Status: DC

## 2017-04-20 NOTE — Assessment & Plan Note (Signed)
Most likely multi-factorial; consider underlying undiagnosed COPD along with acute situational stress disorder.

## 2017-04-20 NOTE — Progress Notes (Signed)
Larry Taylor 46 y.o.   Chief Complaint  Patient presents with  . Fatigue    MONTHS    HISTORY OF PRESENT ILLNESS: This is a 46 y.o. male complaining of general fatigue x 5-6 months; chronic smoker; no lifestyle changes; increased stress at home (new grandson) and more at work; no suicidal ideas but diminished interest in doing things. Not sure he's depressed; blood work last November did not disclose any concerning medical issues; sleeps and eats well; active physical work; stays well hydrated; no other significant symptoms.  HPI   Prior to Admission medications   Medication Sig Start Date End Date Taking? Authorizing Provider  varenicline (CHANTIX STARTING MONTH PAK) 0.5 MG X 11 & 1 MG X 42 tablet Take one 0.5 mg tablet by mouth once daily for 3 days, then increase to one 0.5 mg tablet twice daily for 4 days, then increase to one 1 mg tablet twice daily. Patient not taking: Reported on 11/03/2016 09/01/16   Benjiman Core D, PA-C  varenicline (CHANTIX) 1 MG tablet Take 1 tablet (1 mg total) by mouth 2 (two) times daily. Patient not taking: Reported on 11/03/2016 09/01/16   Magdalene River, PA-C   Did not try Chantix, too expensive. No Known Allergies  Patient Active Problem List   Diagnosis Date Noted  . Incomplete right bundle branch block 11/03/2016    Past Medical History:  Diagnosis Date  . Heart murmur    per patient diagnosed at 46 years old    No past surgical history on file.  Social History   Social History  . Marital status: Single    Spouse name: N/A  . Number of children: N/A  . Years of education: N/A   Occupational History  . environmental services director [employer]   Social History Main Topics  . Smoking status: Current Every Day Smoker    Packs/day: 1.00    Years: 30.00    Types: Cigarettes  . Smokeless tobacco: Never Used  . Alcohol use No  . Drug use: No  . Sexual activity: Not on file   Other Topics Concern  .  Not on file   Social History Narrative  . No narrative on file    Family History  Problem Relation Age of Onset  . Cancer Mother   . Hyperlipidemia Mother   . Cancer Father   . Heart disease Father   . Stroke Father   . High Cholesterol Father   . Cancer Sister   . Cancer Brother      Review of Systems  Constitutional: Positive for malaise/fatigue. Negative for chills, fever and weight loss.  HENT: Negative.  Negative for congestion, ear pain, nosebleeds and sore throat.   Eyes: Negative.  Negative for blurred vision, double vision, discharge and redness.  Respiratory: Positive for wheezing (occassional). Negative for cough, hemoptysis, sputum production and shortness of breath.   Cardiovascular: Negative.  Negative for chest pain, palpitations, claudication and leg swelling.  Gastrointestinal: Negative for abdominal pain, blood in stool, diarrhea, melena, nausea and vomiting.  Genitourinary: Negative.  Negative for dysuria, flank pain and hematuria.  Musculoskeletal: Negative for back pain, myalgias and neck pain.  Skin: Negative for rash.  Neurological: Negative for dizziness, sensory change, focal weakness and headaches.  Endo/Heme/Allergies: Negative.   All other systems reviewed and are negative.  Vitals:   04/20/17 0940  BP: 106/64  Pulse: 67  Resp: 16  Temp: 98.8 F (37.1 C)  Physical Exam  Constitutional: He is oriented to person, place, and time. He appears well-developed and well-nourished.  HENT:  Head: Normocephalic and atraumatic.  Right Ear: External ear normal.  Left Ear: External ear normal.  Nose: Nose normal.  Mouth/Throat: Oropharynx is clear and moist. No oropharyngeal exudate.  Eyes: Conjunctivae and EOM are normal. Pupils are equal, round, and reactive to light.  Neck: Normal range of motion. Neck supple. No JVD present. Carotid bruit is not present. No thyromegaly present.  Cardiovascular: Normal rate, regular rhythm, normal heart sounds  and intact distal pulses.   Pulmonary/Chest: Effort normal and breath sounds normal.  Abdominal: Soft. Bowel sounds are normal. He exhibits no distension. There is no tenderness.  Musculoskeletal: Normal range of motion.  Lymphadenopathy:    He has no cervical adenopathy.  Neurological: He is alert and oriented to person, place, and time. He displays normal reflexes. No sensory deficit. He exhibits normal muscle tone.  Skin: Skin is warm and dry. Capillary refill takes less than 2 seconds. No rash noted.  Psychiatric: He has a normal mood and affect.  Vitals reviewed.    ASSESSMENT & PLAN: Fatigue Most likely multi-factorial; consider underlying undiagnosed COPD along with acute situational stress disorder.  Tobacco use disorder, continuous Willing to try medication. Start Wellbutrin SR 150 mg twice a day.  Summer was seen today for fatigue.  Diagnoses and all orders for this visit:  Fatigue, unspecified type -     CBC with Differential/Platelet -     Comprehensive metabolic panel -     TestT+TestF+SHBG -     Hemoglobin A1c -     HIV antibody -     Sedimentation Rate -     Ambulatory referral to Pulmonology -     buPROPion (WELLBUTRIN SR) 150 MG 12 hr tablet; Take 1 tablet (150 mg total) by mouth 2 (two) times daily.  Tobacco use disorder, continuous -     Ambulatory referral to Smoking Cessation Program -     buPROPion (WELLBUTRIN SR) 150 MG 12 hr tablet; Take 1 tablet (150 mg total) by mouth 2 (two) times daily.  Tobacco abuse counseling    Patient Instructions       IF you received an x-ray today, you will receive an invoice from [facility]. Please contact [facility] at [phone] with questions or concerns regarding your invoice.   IF you received labwork today, you will receive an invoice from [facility]. Please contact [facility] at [phone] with questions or concerns regarding your invoice.   Our billing staff will not be able to  assist you with questions regarding bills from these companies.  You will be contacted with the lab results as soon as they are available. The fastest way to get your results is to activate your My Chart account. Instructions are located on the last page of this paperwork. If you have not heard from [place] regarding the results in 2 weeks, please contact this office.    Bupropion sustained-release tablets (smoking cessation) What is this medicine? BUPROPION (byoo PROE pee on) is used to help people quit smoking. This medicine may be used for other purposes; ask your health care provider or pharmacist if you have questions. COMMON BRAND NAME(S): Buproban, Zyban What should I tell my health care provider before I take this medicine? They need to know if you have any of these conditions: -an eating disorder, such as anorexia or bulimia -bipolar disorder or psychosis -diabetes or high blood sugar, treated with  medication -glaucoma -head injury or brain tumor -heart disease, previous heart attack, or irregular heart beat -high blood pressure -kidney or liver disease -seizures -suicidal thoughts or a previous suicide attempt -Tourette's syndrome -weight loss -an unusual or allergic reaction to bupropion, other medicines, foods, dyes, or preservatives -breast-feeding -pregnant or trying to become pregnant How should I use this medicine? Take this medicine by mouth with a glass of water. Follow the directions on the prescription label. You can take it with or without food. If it upsets your stomach, take it with food. Do not cut, crush or chew this medicine. Take your medicine at regular intervals. If you take this medicine more than once a day, take your second dose at least 8 hours after you take your first dose. To limit difficulty in sleeping, avoid taking this medicine at bedtime. Do not take your medicine more often than directed. Do not stop taking this medicine suddenly except upon the advice  of your doctor. Stopping this medicine too quickly may cause serious side effects. A special MedGuide will be given to you by the pharmacist with each prescription and refill. Be sure to read this information carefully each time. Talk to your pediatrician regarding the use of this medicine in children. Special care may be needed. Overdosage: If you think you have taken too much of this medicine contact a poison control center or emergency room at once. NOTE: This medicine is only for you. Do not share this medicine with others. What if I miss a dose? If you miss a dose, skip the missed dose and take your next tablet at the regular time. There should be at least 8 hours between doses. Do not take double or extra doses. What may interact with this medicine? Do not take this medicine with any of the following medications: -linezolid -MAOIs like Azilect, Carbex, Eldepryl, Marplan, Nardil, and Parnate -methylene blue (injected into a vein) -other medicines that contain bupropion like Wellbutrin This medicine may also interact with the following medications: -alcohol -certain medicines for anxiety or sleep -certain medicines for blood pressure like metoprolol, propranolol -certain medicines for depression or psychotic disturbances -certain medicines for HIV or AIDS like efavirenz, lopinavir, nelfinavir, ritonavir -certain medicines for irregular heart beat like propafenone, flecainide -certain medicines for Parkinson's disease like amantadine, levodopa -certain medicines for seizures like carbamazepine, phenytoin, phenobarbital -cimetidine -clopidogrel -cyclophosphamide -digoxin -furazolidone -isoniazid -nicotine -orphenadrine -procarbazine -steroid medicines like prednisone or cortisone -stimulant medicines for attention disorders, weight loss, or to stay awake -tamoxifen -theophylline -thiotepa -ticlopidine -tramadol -warfarin This list may not describe all possible interactions.  Give your health care provider a list of all the medicines, herbs, non-prescription drugs, or dietary supplements you use. Also tell them if you smoke, drink alcohol, or use illegal drugs. Some items may interact with your medicine. What should I watch for while using this medicine? Visit your doctor or health care professional for regular checks on your progress. This medicine should be used together with a patient support program. It is important to participate in a behavioral program, counseling, or other support program that is recommended by your health care professional. Patients and their families should watch out for new or worsening thoughts of suicide or depression. Also watch out for sudden changes in feelings such as feeling anxious, agitated, panicky, irritable, hostile, aggressive, impulsive, severely restless, overly excited and hyperactive, or not being able to sleep. If this happens, especially at the beginning of treatment or after a change in dose, call your  health care professional. Avoid alcoholic drinks while taking this medicine. Drinking excessive alcoholic beverages, using sleeping or anxiety medicines, or quickly stopping the use of these agents while taking this medicine may increase your risk for a seizure. Do not drive or use heavy machinery until you know how this medicine affects you. This medicine can impair your ability to perform these tasks. Do not take this medicine close to bedtime. It may prevent you from sleeping. Your mouth may get dry. Chewing sugarless gum or sucking hard candy, and drinking plenty of water may help. Contact your doctor if the problem does not go away or is severe. Do not use nicotine patches or chewing gum without the advice of your doctor or health care professional while taking this medicine. You may need to have your blood pressure taken regularly if your doctor recommends that you use both nicotine and this medicine together. What side effects  may I notice from receiving this medicine? Side effects that you should report to your doctor or health care professional as soon as possible: -allergic reactions like skin rash, itching or hives, swelling of the face, lips, or tongue -breathing problems -changes in vision -confusion -elevated mood, decreased need for sleep, racing thoughts, impulsive behavior -fast or irregular heartbeat -hallucinations, loss of contact with reality -increased blood pressure -redness, blistering, peeling or loosening of the skin, including inside the mouth -seizures -suicidal thoughts or other mood changes -unusually weak or tired -vomiting Side effects that usually do not require medical attention (report to your doctor or health care professional if they continue or are bothersome): -constipation -headache -loss of appetite -nausea -tremors -weight loss This list may not describe all possible side effects. Call your doctor for medical advice about side effects. You may report side effects to FDA at 1-800-FDA-1088. Where should I keep my medicine? Keep out of the reach of children. Store at room temperature between 20 and 25 degrees C (68 and 77 degrees F). Protect from light. Keep container tightly closed. Throw away any unused medicine after the expiration date. NOTE: This sheet is a summary. It may not cover all possible information. If you have questions about this medicine, talk to your doctor, pharmacist, or health care provider.  2018 Elsevier/Gold Standard (2016-04-04 13:49:28)  Fatigue Fatigue is feeling tired all of the time, a lack of energy, or a lack of motivation. Occasional or mild fatigue is often a normal response to activity or life in general. However, long-lasting (chronic) or extreme fatigue may indicate an underlying medical condition. Follow these instructions at home: Watch your fatigue for any changes. The following actions may help to lessen any discomfort you are  feeling:  Talk to your health care provider about how much sleep you need each night. Try to get the required amount every night.  Take medicines only as directed by your health care provider.  Eat a healthy and nutritious diet. Ask your health care provider if you need help changing your diet.  Drink enough fluid to keep your urine clear or pale yellow.  Practice ways of relaxing, such as yoga, meditation, massage therapy, or acupuncture.  Exercise regularly.  Change situations that cause you stress. Try to keep your work and personal routine reasonable.  Do not abuse illegal drugs.  Limit alcohol intake to no more than 1 drink per day for nonpregnant women and 2 drinks per day for men. One drink equals 12 ounces of beer, 5 ounces of wine, or 1 ounces of hard liquor.  Take a multivitamin, if directed by your health care provider.  Contact a health care provider if:  Your fatigue does not get better.  You have a fever.  You have unintentional weight loss or gain.  You have headaches.  You have difficulty: ? Falling asleep. ? Sleeping throughout the night.  You feel angry, guilty, anxious, or sad.  You are unable to have a bowel movement (constipation).  You skin is dry.  Your legs or another part of your body is swollen. Get help right away if:  You feel confused.  Your vision is blurry.  You feel faint or pass out.  You have a severe headache.  You have severe abdominal, pelvic, or back pain.  You have chest pain, shortness of breath, or an irregular or fast heartbeat.  You are unable to urinate or you urinate less than normal.  You develop abnormal bleeding, such as bleeding from the rectum, vagina, nose, lungs, or nipples.  You vomit blood.  You have thoughts about harming yourself or committing suicide.  You are worried that you might harm someone else. This information is not intended to replace advice given to you by your health care provider.  Make sure you discuss any questions you have with your health care provider. Document Released: 08/10/2007 Document Revised: 03/20/2016 Document Reviewed: 02/14/2014 Elsevier Interactive Patient Education  2018 Elsevier Inc.      Edwina Barth, MD [facility]

## 2017-04-20 NOTE — Assessment & Plan Note (Signed)
Willing to try medication. Start Wellbutrin SR 150 mg twice a day.

## 2017-04-20 NOTE — Patient Instructions (Addendum)
IF you received an x-ray today, you will receive an invoice from [facility]. Please contact [facility] at [phone] with questions or concerns regarding your invoice.   IF you received labwork today, you will receive an invoice from [facility]. Please contact [facility] at [phone] with questions or concerns regarding your invoice.   Our billing staff will not be able to assist you with questions regarding bills from these companies.  You will be contacted with the lab results as soon as they are available. The fastest way to get your results is to activate your My Chart account. Instructions are located on the last page of this paperwork. If you have not heard from us regarding the results in 2 weeks, please contact this office.    Bupropion sustained-release tablets (smoking cessation) What is this medicine? BUPROPION (byoo PROE pee on) is used to help people quit smoking. This medicine may be used for other purposes; ask your health care provider or pharmacist if you have questions. COMMON BRAND NAME(S): Buproban, Zyban What should I tell my health care provider before I take this medicine? They need to know if you have any of these conditions: -an eating disorder, such as anorexia or bulimia -bipolar disorder or psychosis -diabetes or high blood sugar, treated with medication -glaucoma -head injury or brain tumor -heart disease, previous heart attack, or irregular heart beat -high blood pressure -kidney or liver disease -seizures -suicidal thoughts or a previous suicide attempt -Tourette's syndrome -weight loss -an unusual or allergic reaction to bupropion, other medicines, foods, dyes, or preservatives -breast-feeding -pregnant or trying to become pregnant How should I use this medicine? Take this medicine by mouth with a glass of water. Follow the directions on the prescription label. You can take it with or without food. If it upsets your stomach,  take it with food. Do not cut, crush or chew this medicine. Take your medicine at regular intervals. If you take this medicine more than once a day, take your second dose at least 8 hours after you take your first dose. To limit difficulty in sleeping, avoid taking this medicine at bedtime. Do not take your medicine more often than directed. Do not stop taking this medicine suddenly except upon the advice of your doctor. Stopping this medicine too quickly may cause serious side effects. A special MedGuide will be given to you by the pharmacist with each prescription and refill. Be sure to read this information carefully each time. Talk to your pediatrician regarding the use of this medicine in children. Special care may be needed. Overdosage: If you think you have taken too much of this medicine contact a poison control center or emergency room at once. NOTE: This medicine is only for you. Do not share this medicine with others. What if I miss a dose? If you miss a dose, skip the missed dose and take your next tablet at the regular time. There should be at least 8 hours between doses. Do not take double or extra doses. What may interact with this medicine? Do not take this medicine with any of the following medications: -linezolid -MAOIs like Azilect, Carbex, Eldepryl, Marplan, Nardil, and Parnate -methylene blue (injected into a vein) -other medicines that contain bupropion like Wellbutrin This medicine may also interact with the following medications: -alcohol -certain medicines for anxiety or sleep -certain medicines for blood pressure like metoprolol, propranolol -certain medicines for depression or psychotic disturbances -certain medicines for HIV or AIDS like efavirenz, lopinavir, nelfinavir, ritonavir -  certain medicines for irregular heart beat like propafenone, flecainide -certain medicines for Parkinson's disease like amantadine, levodopa -certain medicines for seizures like  carbamazepine, phenytoin, phenobarbital -cimetidine -clopidogrel -cyclophosphamide -digoxin -furazolidone -isoniazid -nicotine -orphenadrine -procarbazine -steroid medicines like prednisone or cortisone -stimulant medicines for attention disorders, weight loss, or to stay awake -tamoxifen -theophylline -thiotepa -ticlopidine -tramadol -warfarin This list may not describe all possible interactions. Give your health care provider a list of all the medicines, herbs, non-prescription drugs, or dietary supplements you use. Also tell them if you smoke, drink alcohol, or use illegal drugs. Some items may interact with your medicine. What should I watch for while using this medicine? Visit your doctor or health care professional for regular checks on your progress. This medicine should be used together with a patient support program. It is important to participate in a behavioral program, counseling, or other support program that is recommended by your health care professional. Patients and their families should watch out for new or worsening thoughts of suicide or depression. Also watch out for sudden changes in feelings such as feeling anxious, agitated, panicky, irritable, hostile, aggressive, impulsive, severely restless, overly excited and hyperactive, or not being able to sleep. If this happens, especially at the beginning of treatment or after a change in dose, call your health care professional. Avoid alcoholic drinks while taking this medicine. Drinking excessive alcoholic beverages, using sleeping or anxiety medicines, or quickly stopping the use of these agents while taking this medicine may increase your risk for a seizure. Do not drive or use heavy machinery until you know how this medicine affects you. This medicine can impair your ability to perform these tasks. Do not take this medicine close to bedtime. It may prevent you from sleeping. Your mouth may get dry. Chewing sugarless gum  or sucking hard candy, and drinking plenty of water may help. Contact your doctor if the problem does not go away or is severe. Do not use nicotine patches or chewing gum without the advice of your doctor or health care professional while taking this medicine. You may need to have your blood pressure taken regularly if your doctor recommends that you use both nicotine and this medicine together. What side effects may I notice from receiving this medicine? Side effects that you should report to your doctor or health care professional as soon as possible: -allergic reactions like skin rash, itching or hives, swelling of the face, lips, or tongue -breathing problems -changes in vision -confusion -elevated mood, decreased need for sleep, racing thoughts, impulsive behavior -fast or irregular heartbeat -hallucinations, loss of contact with reality -increased blood pressure -redness, blistering, peeling or loosening of the skin, including inside the mouth -seizures -suicidal thoughts or other mood changes -unusually weak or tired -vomiting Side effects that usually do not require medical attention (report to your doctor or health care professional if they continue or are bothersome): -constipation -headache -loss of appetite -nausea -tremors -weight loss This list may not describe all possible side effects. Call your doctor for medical advice about side effects. You may report side effects to FDA at 1-800-FDA-1088. Where should I keep my medicine? Keep out of the reach of children. Store at room temperature between 20 and 25 degrees C (68 and 77 degrees F). Protect from light. Keep container tightly closed. Throw away any unused medicine after the expiration date. NOTE: This sheet is a summary. It may not cover all possible information. If you have questions about this medicine, talk to your doctor, pharmacist,  or health care provider.  2018 Elsevier/Gold Standard (2016-04-04  13:49:28)  Fatigue Fatigue is feeling tired all of the time, a lack of energy, or a lack of motivation. Occasional or mild fatigue is often a normal response to activity or life in general. However, long-lasting (chronic) or extreme fatigue may indicate an underlying medical condition. Follow these instructions at home: Watch your fatigue for any changes. The following actions may help to lessen any discomfort you are feeling:  Talk to your health care provider about how much sleep you need each night. Try to get the required amount every night.  Take medicines only as directed by your health care provider.  Eat a healthy and nutritious diet. Ask your health care provider if you need help changing your diet.  Drink enough fluid to keep your urine clear or pale yellow.  Practice ways of relaxing, such as yoga, meditation, massage therapy, or acupuncture.  Exercise regularly.  Change situations that cause you stress. Try to keep your work and personal routine reasonable.  Do not abuse illegal drugs.  Limit alcohol intake to no more than 1 drink per day for nonpregnant women and 2 drinks per day for men. One drink equals 12 ounces of beer, 5 ounces of wine, or 1 ounces of hard liquor.  Take a multivitamin, if directed by your health care provider.  Contact a health care provider if:  Your fatigue does not get better.  You have a fever.  You have unintentional weight loss or gain.  You have headaches.  You have difficulty: ? Falling asleep. ? Sleeping throughout the night.  You feel angry, guilty, anxious, or sad.  You are unable to have a bowel movement (constipation).  You skin is dry.  Your legs or another part of your body is swollen. Get help right away if:  You feel confused.  Your vision is blurry.  You feel faint or pass out.  You have a severe headache.  You have severe abdominal, pelvic, or back pain.  You have chest pain, shortness of breath, or an  irregular or fast heartbeat.  You are unable to urinate or you urinate less than normal.  You develop abnormal bleeding, such as bleeding from the rectum, vagina, nose, lungs, or nipples.  You vomit blood.  You have thoughts about harming yourself or committing suicide.  You are worried that you might harm someone else. This information is not intended to replace advice given to you by your health care provider. Make sure you discuss any questions you have with your health care provider. Document Released: 08/10/2007 Document Revised: 03/20/2016 Document Reviewed: 02/14/2014 Elsevier Interactive Patient Education  [employer].

## 2017-04-22 LAB — CBC WITH DIFFERENTIAL/PLATELET
BASOS ABS: 0 10*3/uL (ref 0.0–0.2)
Basos: 0 %
EOS (ABSOLUTE): 0.1 10*3/uL (ref 0.0–0.4)
Eos: 1 %
Hematocrit: 50.5 % (ref 37.5–51.0)
Hemoglobin: 17.5 g/dL (ref 13.0–17.7)
IMMATURE GRANULOCYTES: 0 %
Immature Grans (Abs): 0 10*3/uL (ref 0.0–0.1)
Lymphocytes Absolute: 2.4 10*3/uL (ref 0.7–3.1)
Lymphs: 15 %
MCH: 31.6 pg (ref 26.6–33.0)
MCHC: 34.7 g/dL (ref 31.5–35.7)
MCV: 91 fL (ref 79–97)
MONOS ABS: 0.7 10*3/uL (ref 0.1–0.9)
Monocytes: 4 %
NEUTROS PCT: 80 %
Neutrophils Absolute: 12.5 10*3/uL — ABNORMAL HIGH (ref 1.4–7.0)
PLATELETS: 223 10*3/uL (ref 150–379)
RBC: 5.54 x10E6/uL (ref 4.14–5.80)
RDW: 14.3 % (ref 12.3–15.4)
WBC: 15.8 10*3/uL — AB (ref 3.4–10.8)

## 2017-04-22 LAB — TESTT+TESTF+SHBG
SEX HORMONE BINDING: 32.5 nmol/L (ref 16.5–55.9)
TESTOSTERONE FREE: 9.2 pg/mL (ref 6.8–21.5)
TESTOSTERONE, TOTAL: 379.3 ng/dL (ref 264.0–916.0)

## 2017-04-22 LAB — COMPREHENSIVE METABOLIC PANEL
ALK PHOS: 80 IU/L (ref 39–117)
ALT: 33 IU/L (ref 0–44)
AST: 22 IU/L (ref 0–40)
Albumin/Globulin Ratio: 1.6 (ref 1.2–2.2)
Albumin: 4.4 g/dL (ref 3.5–5.5)
BUN/Creatinine Ratio: 12 (ref 9–20)
BUN: 12 mg/dL (ref 6–24)
Bilirubin Total: 0.4 mg/dL (ref 0.0–1.2)
CALCIUM: 10.1 mg/dL (ref 8.7–10.2)
CO2: 21 mmol/L (ref 20–29)
CREATININE: 1.03 mg/dL (ref 0.76–1.27)
Chloride: 103 mmol/L (ref 96–106)
GFR calc Af Amer: 101 mL/min/{1.73_m2} (ref >59)
GFR, EST NON AFRICAN AMERICAN: 87 mL/min/{1.73_m2} (ref >59)
Globulin, Total: 2.7 g/dL (ref 1.5–4.5)
Glucose: 81 mg/dL (ref 65–99)
POTASSIUM: 4.7 mmol/L (ref 3.5–5.2)
Sodium: 139 mmol/L (ref 134–144)
Total Protein: 7.1 g/dL (ref 6.0–8.5)

## 2017-04-22 LAB — HIV ANTIBODY (ROUTINE TESTING W REFLEX): HIV Screen 4th Generation wRfx: NONREACTIVE

## 2017-04-22 LAB — HEMOGLOBIN A1C
ESTIMATED AVERAGE GLUCOSE: 111 mg/dL
Hgb A1c MFr Bld: 5.5 % (ref 4.8–5.6)

## 2017-04-22 LAB — SEDIMENTATION RATE: Sed Rate: 7 mm/hr (ref 0–15)

## 2017-04-23 ENCOUNTER — Telehealth: Payer: Self-pay | Admitting: Emergency Medicine

## 2017-04-23 NOTE — Telephone Encounter (Signed)
Spoke to patient about blood results. +leukocytosis but no clinical symptoms of acute infection. He received a call from referral center yesterday but has not yet scheduled appointment.

## 2019-05-22 ENCOUNTER — Emergency Department (HOSPITAL_COMMUNITY)
Admission: EM | Admit: 2019-05-22 | Discharge: 2019-05-22 | Disposition: A | Discharge status: Home / Self Care | Payer: Self-pay | Attending: Emergency Medicine | Admitting: Emergency Medicine

## 2019-05-22 ENCOUNTER — Emergency Department (HOSPITAL_COMMUNITY): Payer: Self-pay

## 2019-05-22 ENCOUNTER — Encounter (HOSPITAL_COMMUNITY): Payer: Self-pay | Admitting: Emergency Medicine

## 2019-05-22 ENCOUNTER — Other Ambulatory Visit: Payer: Self-pay

## 2019-05-22 DIAGNOSIS — R0789 Other chest pain: Secondary | ICD-10-CM | POA: Insufficient documentation

## 2019-05-22 DIAGNOSIS — Z7982 Long term (current) use of aspirin: Secondary | ICD-10-CM | POA: Insufficient documentation

## 2019-05-22 DIAGNOSIS — F17210 Nicotine dependence, cigarettes, uncomplicated: Secondary | ICD-10-CM | POA: Insufficient documentation

## 2019-05-22 DIAGNOSIS — R42 Dizziness and giddiness: Secondary | ICD-10-CM | POA: Insufficient documentation

## 2019-05-22 DIAGNOSIS — R1013 Epigastric pain: Secondary | ICD-10-CM | POA: Insufficient documentation

## 2019-05-22 DIAGNOSIS — R5383 Other fatigue: Secondary | ICD-10-CM | POA: Insufficient documentation

## 2019-05-22 DIAGNOSIS — R51 Headache: Secondary | ICD-10-CM | POA: Insufficient documentation

## 2019-05-22 DIAGNOSIS — Z79899 Other long term (current) drug therapy: Secondary | ICD-10-CM | POA: Insufficient documentation

## 2019-05-22 DIAGNOSIS — M542 Cervicalgia: Secondary | ICD-10-CM | POA: Insufficient documentation

## 2019-05-22 LAB — COMPREHENSIVE METABOLIC PANEL
ALT: 38 U/L (ref 0–44)
AST: 26 U/L (ref 15–41)
Albumin: 4.1 g/dL (ref 3.5–5.0)
Alkaline Phosphatase: 74 U/L (ref 38–126)
Anion gap: 7 (ref 5–15)
BUN: 12 mg/dL (ref 6–20)
CO2: 24 mmol/L (ref 22–32)
Calcium: 10.2 mg/dL (ref 8.9–10.3)
Chloride: 107 mmol/L (ref 98–111)
Creatinine, Ser: 1 mg/dL (ref 0.61–1.24)
GFR calc Af Amer: >60 mL/min (ref >60)
GFR calc non Af Amer: >60 mL/min (ref >60)
Glucose, Bld: 104 mg/dL — ABNORMAL HIGH (ref 70–99)
Potassium: 4 mmol/L (ref 3.5–5.1)
Sodium: 138 mmol/L (ref 135–145)
Total Bilirubin: 0.7 mg/dL (ref 0.3–1.2)
Total Protein: 7.4 g/dL (ref 6.5–8.1)

## 2019-05-22 LAB — CBC WITH DIFFERENTIAL/PLATELET
Abs Immature Granulocytes: 0.06 10*3/uL (ref 0.00–0.07)
Basophils Absolute: 0.1 10*3/uL (ref 0.0–0.1)
Basophils Relative: 1 %
Eosinophils Absolute: 0.1 10*3/uL (ref 0.0–0.5)
Eosinophils Relative: 1 %
HCT: 52.9 % — ABNORMAL HIGH (ref 39.0–52.0)
Hemoglobin: 18 g/dL — ABNORMAL HIGH (ref 13.0–17.0)
Immature Granulocytes: 0 %
Lymphocytes Relative: 24 %
Lymphs Abs: 3.2 10*3/uL (ref 0.7–4.0)
MCH: 31.4 pg (ref 26.0–34.0)
MCHC: 34 g/dL (ref 30.0–36.0)
MCV: 92.3 fL (ref 80.0–100.0)
Monocytes Absolute: 0.6 10*3/uL (ref 0.1–1.0)
Monocytes Relative: 5 %
Neutro Abs: 9.5 10*3/uL — ABNORMAL HIGH (ref 1.7–7.7)
Neutrophils Relative %: 69 %
Platelets: 216 10*3/uL (ref 150–400)
RBC: 5.73 MIL/uL (ref 4.22–5.81)
RDW: 13.5 % (ref 11.5–15.5)
WBC: 13.6 10*3/uL — ABNORMAL HIGH (ref 4.0–10.5)
nRBC: 0 % (ref 0.0–0.2)

## 2019-05-22 LAB — TROPONIN I (HIGH SENSITIVITY)
Troponin I (High Sensitivity): 4 ng/L (ref <18)
Troponin I (High Sensitivity): 4 ng/L (ref <18)

## 2019-05-22 LAB — D-DIMER, QUANTITATIVE: D-Dimer, Quant: <0.27 ug/mL-FEU (ref 0.00–0.50)

## 2019-05-22 MED ORDER — SODIUM CHLORIDE 0.9 % IV BOLUS
1000.0000 mL | Freq: Once | INTRAVENOUS | Status: AC
  Administered 2019-05-22: 1000 mL via INTRAVENOUS

## 2019-05-22 NOTE — Discharge Instructions (Addendum)
You can take omeprazole, once daily, available over the counter according to label instructions for possible reflux/indigestion.

## 2019-05-22 NOTE — ED Provider Notes (Signed)
[facility] EMERGENCY DEPARTMENT Provider Note   CSN: [id number] Arrival date & time: 05/22/19  1033    History   Chief Complaint Chief Complaint  Patient presents with  . Chest Pain  . Dizziness    HPI Larry Taylor is a 48 y.o. male.     The history is provided by the patient and medical records. No language interpreter was used.  Chest Pain Associated symptoms: dizziness   Dizziness Associated symptoms: chest pain    Larry Taylor is a 48 y.o. male who presents to the Emergency Department complaining of chest pain.  He presents to the ED complaining of chest pain and lightheadedness, ongoing for the last six weeks, worse yesterday.  Lightheadedness is described as spinning, improved with rest.  Chest pain is in varying quality and location, waxes and wanes.  Pain varies between sharp and achey, episodes last 30-60 minutes at a time.  He complains of associated left sided neck pain that feels like a pulled muscle and light headache. Also complains of fatigue, nausea, epigastric pain for months, occasional diaphoresis.  He is a smoker, former alcoholic.  Family hx of cancer, CHF.   Past Medical History:  Diagnosis Date  . Heart murmur    per patient diagnosed at 48 years old    Patient Active Problem List   Diagnosis Date Noted  . Fatigue 04/20/2017  . Tobacco use disorder, continuous 04/20/2017  . Tobacco abuse counseling 04/20/2017  . Incomplete right bundle branch block 11/03/2016    History reviewed. No pertinent surgical history.      Home Medications    Prior to Admission medications   Medication Sig Start Date End Date Taking? Authorizing Provider  aspirin EC 81 MG tablet Take 81 mg by mouth daily.   Yes [provider]  ibuprofen (ADVIL) 100 MG tablet Take 100 mg by mouth every 6 (six) hours as needed for fever.   Yes [provider]  omeprazole (PRILOSEC) 20 MG capsule Take 20 mg by mouth daily.   Yes [provider]  vitamin C (ASCORBIC ACID) 500 MG tablet Take 1,000 mg by mouth daily.   Yes [provider]  buPROPion (WELLBUTRIN SR) 150 MG 12 hr tablet Take 1 tablet (150 mg total) by mouth 2 (two) times daily. Patient not taking: Reported on 05/22/2019 04/20/17   Horald Pollen, MD  varenicline (CHANTIX STARTING MONTH PAK) 0.5 MG X 11 & 1 MG X 42 tablet Take one 0.5 mg tablet by mouth once daily for 3 days, then increase to one 0.5 mg tablet twice daily for 4 days, then increase to one 1 mg tablet twice daily. Patient not taking: Reported on 11/03/2016 09/01/16   Tenna Delaine D, PA-C  varenicline (CHANTIX) 1 MG tablet Take 1 tablet (1 mg total) by mouth 2 (two) times daily. Patient not taking: Reported on 11/03/2016 09/01/16   Leonie Douglas, PA-C    Family History Family History  Problem Relation Age of Onset  . Cancer Mother   . Hyperlipidemia Mother   . Cancer Father   . Heart disease Father   . Stroke Father   . High Cholesterol Father   . Cancer Sister   . Cancer Brother     Social History Social History   Tobacco Use  . Smoking status: Current Every Day Smoker    Packs/day: 1.00    Years: 30.00    Pack years: 30.00    Types: Cigarettes  .  Smokeless tobacco: Never Used  Substance Use Topics  . Alcohol use: No  . Drug use: No     Allergies   Patient has no known allergies.   Review of Systems Review of Systems  Cardiovascular: Positive for chest pain.  Neurological: Positive for dizziness.  All other systems reviewed and are negative.    Physical Exam Updated Vital Signs BP (!) 142/94   Pulse (!) 52   Temp 98.3 F (36.8 C) (Oral)   Resp 19   Ht 5\' 9"  (1.753 m)   Wt 98.9 kg   SpO2 95%   BMI 32.19 kg/m   Physical Exam Vitals signs and nursing note reviewed.  Constitutional:      Appearance: He is well-developed.  HENT:     Head: Normocephalic and atraumatic.  Cardiovascular:     Rate and Rhythm: Normal rate and regular  rhythm.     Heart sounds: No murmur.  Pulmonary:     Effort: Pulmonary effort is normal. No respiratory distress.     Breath sounds: Normal breath sounds.  Chest:     Chest wall: No tenderness.  Abdominal:     Palpations: Abdomen is soft.     Tenderness: There is no abdominal tenderness. There is no guarding or rebound.  Musculoskeletal:        General: No swelling or tenderness.  Skin:    General: Skin is warm and dry.  Neurological:     Mental Status: He is alert and oriented to person, place, and time.  Psychiatric:        Mood and Affect: Mood normal.        Behavior: Behavior normal.      ED Treatments / Results  Labs (all labs ordered are listed, but only abnormal results are displayed) Labs Reviewed  COMPREHENSIVE METABOLIC PANEL - Abnormal; Notable for the following components:      Result Value   Glucose, Bld 104 (*)    All other components within normal limits  CBC WITH DIFFERENTIAL/PLATELET - Abnormal; Notable for the following components:   WBC 13.6 (*)    Hemoglobin 18.0 (*)    HCT 52.9 (*)    Neutro Abs 9.5 (*)    All other components within normal limits  D-DIMER, QUANTITATIVE (NOT AT [facility])  TROPONIN I (HIGH SENSITIVITY)  TROPONIN I (HIGH SENSITIVITY)    EKG EKG Interpretation  Date/Time:  Sunday May 22 2019 10:42:12 EDT Ventricular Rate:  67 PR Interval:  194 QRS Duration: 102 QT Interval:  358 QTC Calculation: 378 R Axis:   -37 Text Interpretation:  Normal sinus rhythm Left axis deviation RSR' or QR pattern in V1 suggests right ventricular conduction delay Abnormal ECG no prior available for comparison Confirmed by Tilden Fossaees, Alexande Sheerin [phone]([id number]) on 05/22/2019 11:01:03 AM   Radiology Dg Chest 2 View  Result Date: 05/22/2019 CLINICAL DATA:  Left-sided chest pain for 1 month. Dizziness beginning yesterday. EXAM: CHEST - 2 VIEW COMPARISON:  None. FINDINGS: The heart size and mediastinal contours are within normal limits. Both lungs are clear. No  evidence of pneumothorax or pleural effusion. The visualized skeletal structures are unremarkable. IMPRESSION: No active cardiopulmonary disease. Electronically Signed   By: Danae OrleansJohn A Stahl M.D.   On: 05/22/2019 12:21    Procedures Procedures (including critical care time)  Medications Ordered in ED Medications  sodium chloride 0.9 % bolus 1,000 mL (0 mLs Intravenous Stopped 05/22/19 1341)     Initial Impression / Assessment and Plan / ED Course  I  have reviewed the triage vital signs and the nursing notes.  Pertinent labs & imaging results that were available during my care of the patient were reviewed by me and considered in my medical decision making (see chart for details).        Patient here for evaluation of six weeks of intermittent migratory chest pain that at times radiates to his neck. He is well appearing on evaluation and in no acute distress. EKG unchanged when compared to priors. Troponin is negative times two and D dimer is negative. Presentation is not consistent with ACS, PE, dissection, pneumonia. Discussed with patient home care for chest pain, unclear etiology. Discussed possible reflux contributing to his symptoms. Discussed home care, outpatient follow-up and return precautions.  Final Clinical Impressions(s) / ED Diagnoses   Final diagnoses:  Atypical chest pain  Lightheadedness    ED Discharge Orders    None       Tilden Fossaees, Marcena Dias, MD 05/22/19 1445

## 2019-05-22 NOTE — ED Triage Notes (Signed)
Pt. Stated, Larry Taylor been having chest pain off and on for 6 weeks and yesterday I had some light headedness. Ive also had some times when my face and neck is red.

## 2019-05-22 NOTE — ED Notes (Signed)
Pt transported to XRay 

## 2019-05-22 NOTE — ED Notes (Signed)
Pt returned from X Ray.

## 2020-08-23 IMAGING — DX CHEST - 2 VIEW
2 series · 2 of 2 positions shown · non-contrast
Comparison: None.

CLINICAL DATA: Left-sided chest pain for 1 month. Dizziness
beginning yesterday.

EXAM:
CHEST - 2 VIEW

[w chest pa]
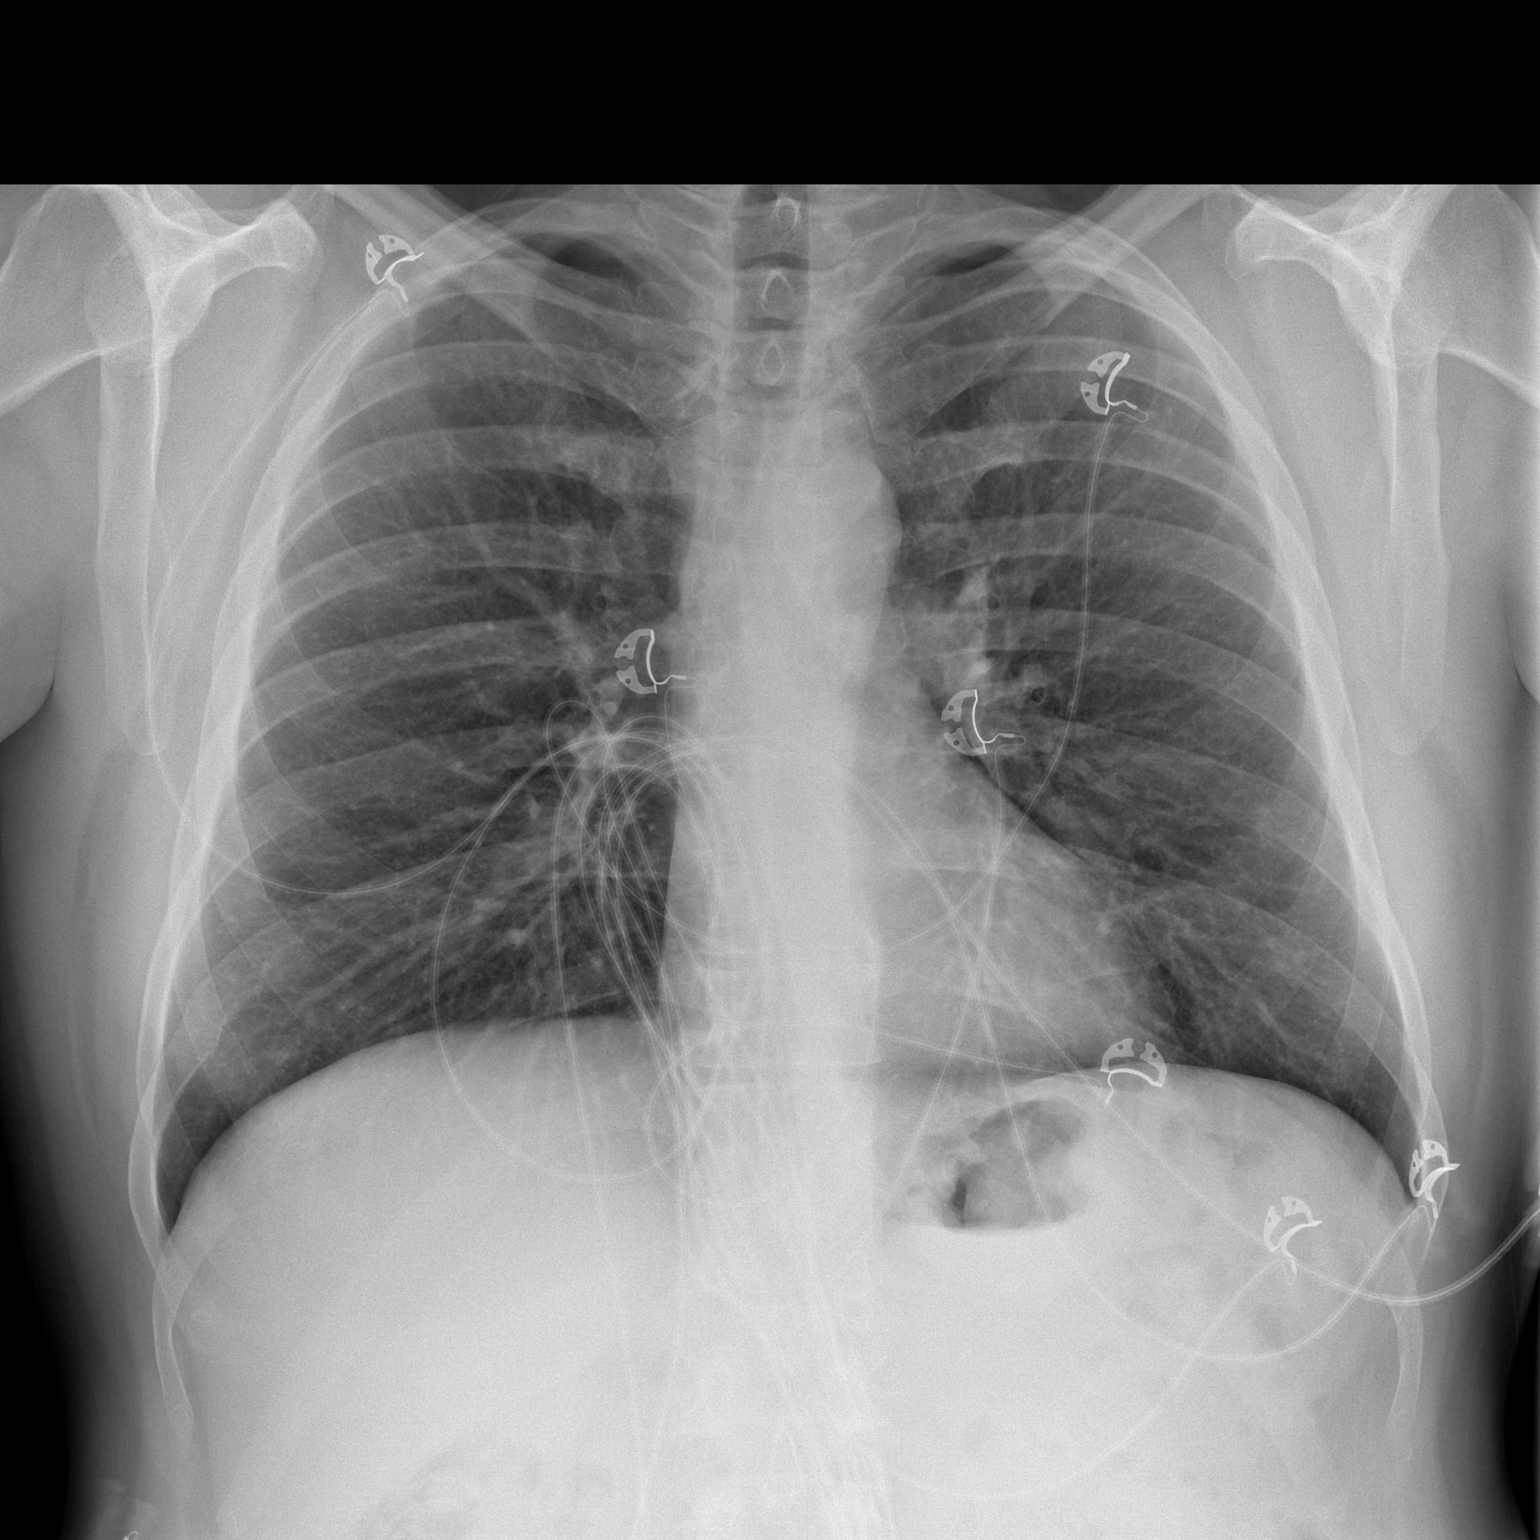

[w chest lat]
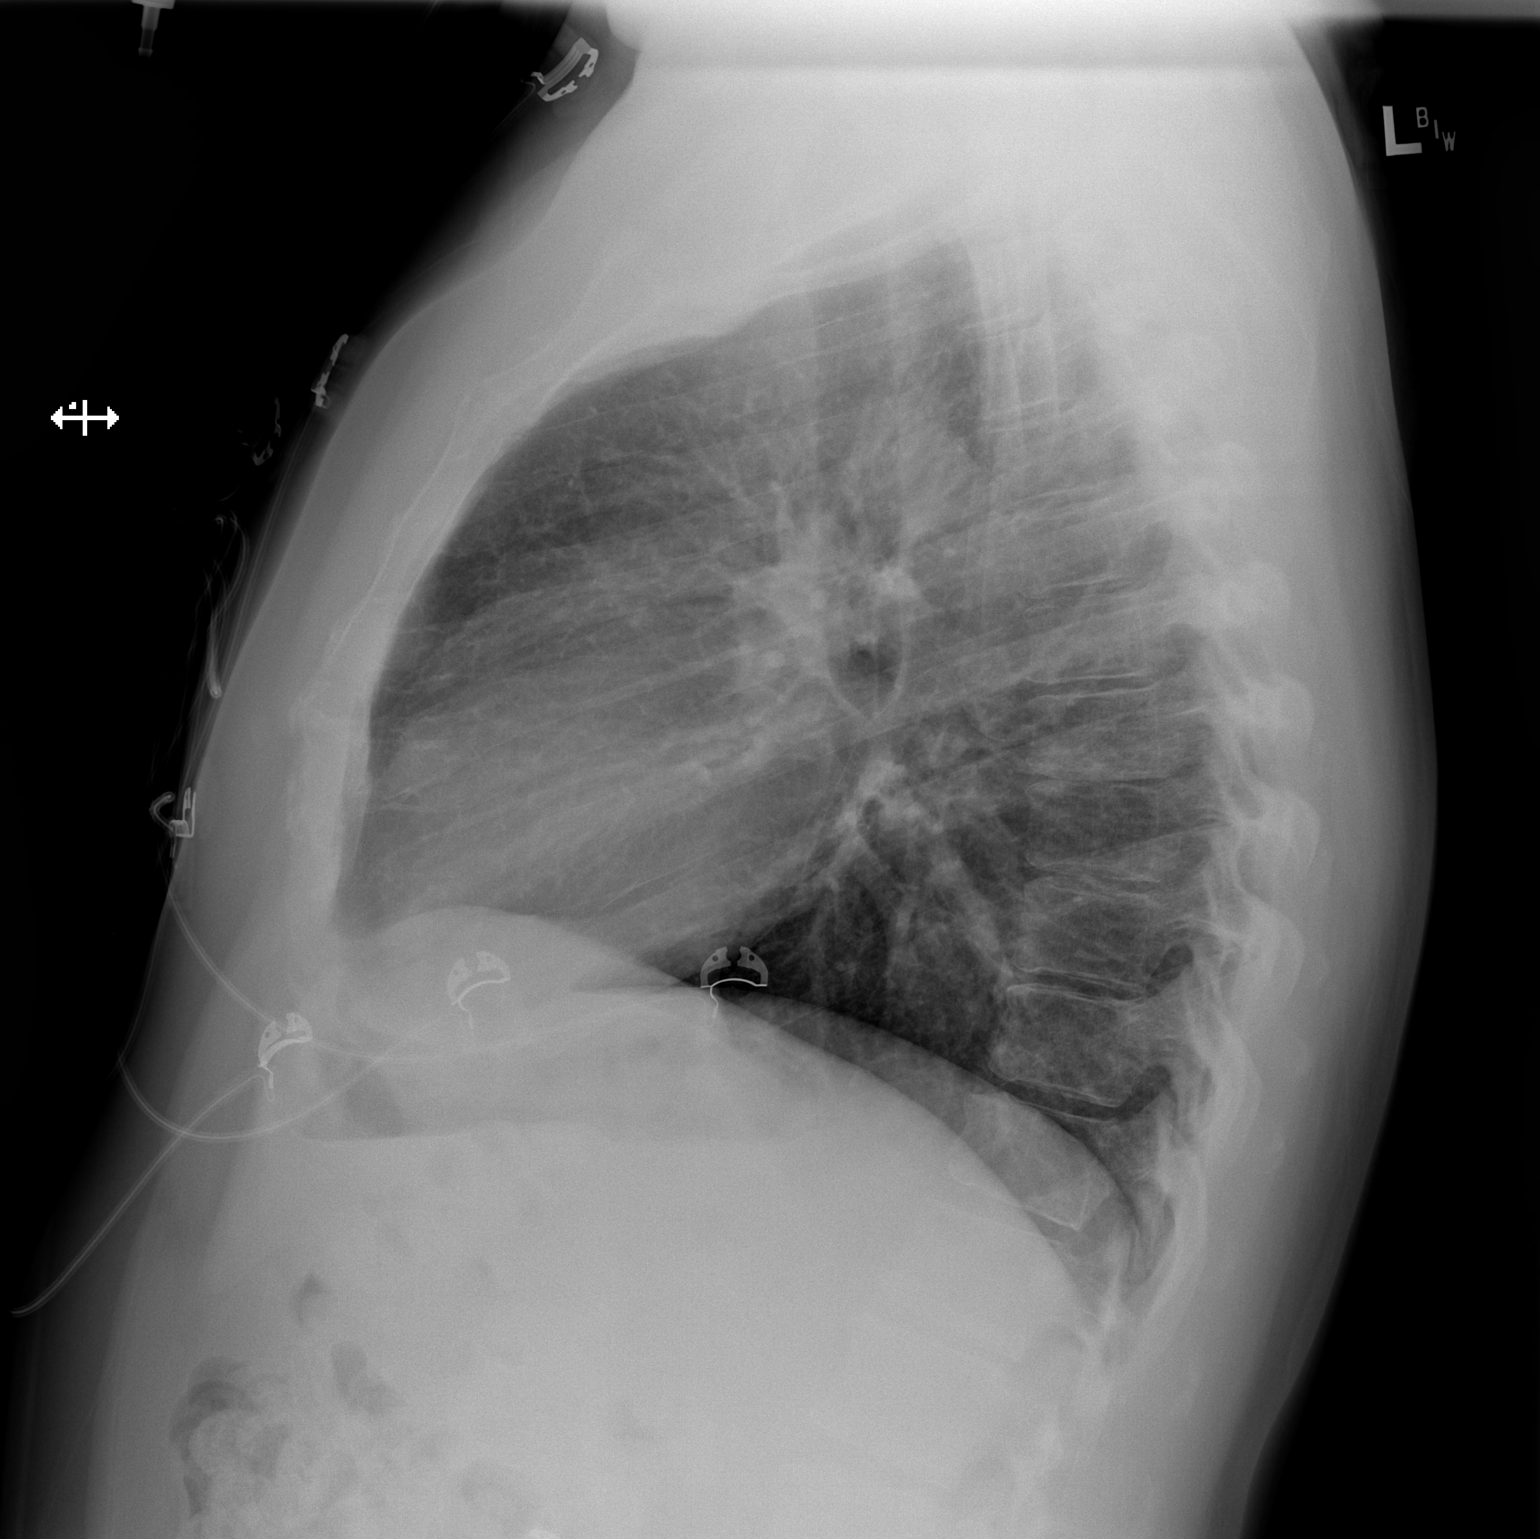

[2 of 2 positions shown; findings below may reference images not displayed]

FINDINGS: The heart size and mediastinal contours are within normal limits.
Both lungs are clear. No evidence of pneumothorax or pleural
effusion. The visualized skeletal structures are unremarkable.
IMPRESSION: No active cardiopulmonary disease.

## 2020-11-30 ENCOUNTER — Other Ambulatory Visit: Payer: Self-pay

## 2020-11-30 ENCOUNTER — Ambulatory Visit (INDEPENDENT_AMBULATORY_CARE_PROVIDER_SITE_OTHER): Payer: No Typology Code available for payment source | Admitting: Family Medicine

## 2020-11-30 ENCOUNTER — Encounter: Payer: Self-pay | Admitting: Family Medicine

## 2020-11-30 VITALS — BP 130/78 | HR 68 | Temp 98.0°F | Ht 69.0 in | Wt 218.0 lb

## 2020-11-30 DIAGNOSIS — R5383 Other fatigue: Secondary | ICD-10-CM | POA: Diagnosis not present

## 2020-11-30 DIAGNOSIS — R079 Chest pain, unspecified: Secondary | ICD-10-CM | POA: Diagnosis not present

## 2020-11-30 DIAGNOSIS — Z716 Tobacco abuse counseling: Secondary | ICD-10-CM

## 2020-11-30 DIAGNOSIS — R12 Heartburn: Secondary | ICD-10-CM | POA: Diagnosis not present

## 2020-11-30 DIAGNOSIS — F17209 Nicotine dependence, unspecified, with unspecified nicotine-induced disorders: Secondary | ICD-10-CM

## 2020-11-30 DIAGNOSIS — F1911 Other psychoactive substance abuse, in remission: Secondary | ICD-10-CM | POA: Insufficient documentation

## 2020-11-30 DIAGNOSIS — L821 Other seborrheic keratosis: Secondary | ICD-10-CM

## 2020-11-30 LAB — LIPID PANEL
Cholesterol: 201 mg/dL — ABNORMAL HIGH (ref 0–200)
HDL: 40.7 mg/dL (ref >39.00)
LDL Cholesterol: 143 mg/dL — ABNORMAL HIGH (ref 0–99)
NonHDL: 160.36
Total CHOL/HDL Ratio: 5
Triglycerides: 86 mg/dL (ref 0.0–149.0)
VLDL: 17.2 mg/dL (ref 0.0–40.0)

## 2020-11-30 MED ORDER — VARENICLINE TARTRATE 1 MG PO TABS: 1.0000 mg | ORAL_TABLET | Freq: Two times a day (BID) | ORAL | 2 refills | Status: DC

## 2020-11-30 MED ORDER — CHANTIX STARTING MONTH PAK 0.5 MG X 11 & 1 MG X 42 PO TABS: ORAL_TABLET | ORAL | 0 refills | Status: DC
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
## 2020-12-04 ENCOUNTER — Telehealth: Payer: Self-pay

## 2020-12-04 DIAGNOSIS — F17209 Nicotine dependence, unspecified, with unspecified nicotine-induced disorders: Secondary | ICD-10-CM

## 2020-12-04 DIAGNOSIS — R5383 Other fatigue: Secondary | ICD-10-CM

## 2020-12-04 MED ORDER — BUPROPION HCL ER (SR) 150 MG PO TB12: 150.0000 mg | ORAL_TABLET | Freq: Two times a day (BID) | ORAL | 3 refills | Status: DC

## 2020-12-04 NOTE — Addendum Note (Signed)
Addended by: Loyola Mast on: 12/04/2020 01:12 PM   Modules accepted: Orders

## 2020-12-04 NOTE — Telephone Encounter (Signed)
Pt calling to inform provider that the medication Chantix has been recalled, according to his pharmacy.  Pt asked was there something else that may can help him.  Please advise. CB# [phone]

## 2020-12-04 NOTE — Telephone Encounter (Signed)
Please advise message below  °

## 2020-12-04 NOTE — Telephone Encounter (Signed)
Patient aware of message below and will pick up Rx today 

## 2020-12-16 NOTE — Progress Notes (Deleted)
Cardiology Office Note:    Date:  12/16/2020   ID:  Larry Taylor, DOB April 15, 1971, MRN [id number]  PCP:  Loyola Mast, MD   [facility]  Cardiologist:  No primary care provider on file. *** Advanced Practice Provider:  No care team member to display Electrophysiologist:  None    Referring MD: Loyola Mast, MD     History of Present Illness:    Larry Taylor is a 50 y.o. male with a hx of tobacco abuse and prior polysubstance abuse who was referred by Dr. Veto Kemps for further evaluation of chest pain.  Past Medical History:  Diagnosis Date  . Heart murmur    per patient diagnosed at 50 years old    No past surgical history on file.  Current Medications: No outpatient medications have been marked as taking for the 12/18/20 encounter (Appointment) with Meriam Sprague, MD.     Allergies:   Patient has no known allergies.   Social History   Socioeconomic History  . Marital status: Married    Spouse name: Not on file  . Number of children: Not on file  . Years of education: Not on file  . Highest education level: Not on file  Occupational History  . Occupation: Driver    Comment: [employer]  Tobacco Use  . Smoking status: Current Every Day Smoker    Packs/day: 1.00    Years: 30.00    Pack years: 30.00    Types: Cigarettes  . Smokeless tobacco: Never Used  Vaping Use  . Vaping Use: Never used  Substance and Sexual Activity  . Alcohol use: No    Comment: Past abuse of beer (1 case a day) quit in 2009.  . Drug use: Not Currently    Types: "Crack" cocaine    Comment: Opioids- Quit after rehab in 2009.  Marland Kitchen Sexual activity: Not on file  Other Topics Concern  . Not on file  Social History Narrative  . Not on file   Social Determinants of Health   Financial Resource Strain: Not on file  Food Insecurity: Not on file  Transportation Needs: Not on file  Physical Activity: Not on file  Stress: Not on file  Social Connections: Not  on file     Family History: The patient's ***family history includes Cancer in his brother, father, mother, and sister; Heart disease in his father; High Cholesterol in his father; Hyperlipidemia in his mother; Stroke in his father.  ROS:   Please see the history of present illness.    *** All other systems reviewed and are negative.  EKGs/Labs/Other Studies Reviewed:    The following studies were reviewed today: ***  EKG:  EKG is *** ordered today.  The ekg ordered today demonstrates ***  Recent Labs: No results found for requested labs within last 8760 hours.  Recent Lipid Panel    Component Value Date/Time   CHOL 201 (H) 11/30/2020 0950   TRIG 86.0 11/30/2020 0950   HDL 40.70 11/30/2020 0950   CHOLHDL 5 11/30/2020 0950   VLDL 17.2 11/30/2020 0950   LDLCALC 143 (H) 11/30/2020 0950     Risk Assessment/Calculations:   {Does this patient have ATRIAL FIBRILLATION?:[id number]}   Physical Exam:    VS:  There were no vitals taken for this visit.    Wt Readings from Last 3 Encounters:  11/30/20 218 lb (98.9 kg)  05/22/19 218 lb (98.9 kg)  04/20/17 215 lb 9.6 oz (97.8 kg)  GEN: *** Well nourished, well developed in no acute distress HEENT: Normal NECK: No JVD; No carotid bruits LYMPHATICS: No lymphadenopathy CARDIAC: ***RRR, no murmurs, rubs, gallops RESPIRATORY:  Clear to auscultation without rales, wheezing or rhonchi  ABDOMEN: Soft, non-tender, non-distended MUSCULOSKELETAL:  No edema; No deformity  SKIN: Warm and dry NEUROLOGIC:  Alert and oriented x 3 PSYCHIATRIC:  Normal affect   ASSESSMENT:    No diagnosis found. PLAN:    In order of problems listed above:  #Chest Pain:  #Tobacco Abuse:   {Are you ordering a CV Procedure (e.g. stress test, cath, DCCV, TEE, etc)?   Press F2        :[id number]}    Medication Adjustments/Labs and Tests Ordered: Current medicines are reviewed at length with the patient today.  Concerns regarding medicines are  outlined above.  No orders of the defined types were placed in this encounter.  No orders of the defined types were placed in this encounter.   There are no Patient Instructions on file for this visit.   Signed, Meriam Sprague, MD  12/16/2020 12:52 PM    [facility]

## 2020-12-18 ENCOUNTER — Other Ambulatory Visit: Payer: Self-pay | Admitting: Family Medicine

## 2020-12-18 ENCOUNTER — Ambulatory Visit: Payer: No Typology Code available for payment source | Admitting: Cardiology

## 2020-12-18 DIAGNOSIS — F17209 Nicotine dependence, unspecified, with unspecified nicotine-induced disorders: Secondary | ICD-10-CM

## 2020-12-18 DIAGNOSIS — R5383 Other fatigue: Secondary | ICD-10-CM

## 2020-12-30 NOTE — Progress Notes (Signed)
Cardiology Office Note:    Date:  01/01/2021   ID:  Larry Taylor, DOB [birth year], MRN [id number]  PCP:  Loyola Mast, MD   [facility]  Cardiologist:  No primary care provider on file.  Advanced Practice Provider:  No care team member to display Electrophysiologist:  None    Referring MD: Loyola Mast, MD    History of Present Illness:    Larry Taylor is a 50 y.o. male with a hx of tobacco abuse and prior polysubstance abuse (quit in 2007) who was referred by Dr. Veto Kemps for further evaluation of chest pain.  Was seen by Dr. Veto Kemps on 11/30/20 where he was complaining of lack of energy and sharp epigastric pain. Has history of a crush chest injury and was told he may have lifelong chest pain, but the patient was also concerned that he may have underlying heart disease as well. He is a current 1ppd smoker.  Patient states that he has been feeling more fatigued over the past year. Has gotten progressively worse over that time. Has also noted some shortness of breath with exertion which he attributes to smoking. Also having episodes of sharp chest pain that are intermittent and not exertional related. Occurs 2-3x/month and lasts before abating. Some abdominal bloating/chest discomfort after eating as well for which he has started omeprazole. Continues to smoke 1 ppd but wants to stop.  Father with CHF in his 57s (passed away)  Past Medical History:  Diagnosis Date  . Heart murmur    per patient diagnosed at 50 years old    History reviewed. No pertinent surgical history.  Current Medications: Current Meds  Medication Sig  . Ascorbic Acid (VITAMIN C) 1000 MG tablet Take 1,000 mg by mouth daily.  Marland Kitchen ibuprofen (ADVIL) 100 MG tablet Take 100 mg by mouth every 6 (six) hours as needed for fever.  . metoprolol tartrate (LOPRESSOR) 50 MG tablet Take 1 tablet (50 mg total) by mouth once for 1 dose. Take 1 tablet 2 hours prior to procedure  . nicotine  (NICODERM CQ) 21 mg/24hr patch Place 1 patch (21 mg total) onto the skin daily.  Marland Kitchen omeprazole (PRILOSEC) 20 MG capsule Take 20 mg by mouth daily.  . rosuvastatin (CRESTOR) 10 MG tablet Take 1 tablet (10 mg total) by mouth daily.     Allergies:   Patient has no known allergies.   Social History   Socioeconomic History  . Marital status: Married    Spouse name: Not on file  . Number of children: Not on file  . Years of education: Not on file  . Highest education level: Not on file  Occupational History  . Occupation: Driver    Comment: [employer]  Tobacco Use  . Smoking status: Current Every Day Smoker    Packs/day: 1.00    Years: 30.00    Pack years: 30.00    Types: Cigarettes  . Smokeless tobacco: Never Used  Vaping Use  . Vaping Use: Never used  Substance and Sexual Activity  . Alcohol use: No    Comment: Past abuse of beer (1 case a day) quit in 2009.  . Drug use: Not Currently    Types: "Crack" cocaine    Comment: Opioids- Quit after rehab in 2009.  Marland Kitchen Sexual activity: Not on file  Other Topics Concern  . Not on file  Social History Narrative  . Not on file   Social Determinants of Health   Financial  Resource Strain: Not on file  Food Insecurity: Not on file  Transportation Needs: Not on file  Physical Activity: Not on file  Stress: Not on file  Social Connections: Not on file     Family History: The patient's family history includes Cancer in his brother, father, mother, and sister; Heart disease in his father; High Cholesterol in his father; Hyperlipidemia in his mother; Stroke in his father.  ROS:   Please see the history of present illness.    Review of Systems  Constitutional: Positive for malaise/fatigue. Negative for chills and fever.  HENT: Negative for hearing loss and sore throat.   Eyes: Negative for blurred vision and redness.  Respiratory: Positive for shortness of breath.   Cardiovascular: Positive for chest pain. Negative for palpitations,  orthopnea, claudication, leg swelling and PND.  Gastrointestinal: Negative for melena, nausea and vomiting.  Genitourinary: Negative for dysuria and flank pain.  Musculoskeletal: Positive for joint pain and myalgias. Negative for falls.  Neurological: Negative for dizziness and loss of consciousness.  Endo/Heme/Allergies: Negative for polydipsia.  Psychiatric/Behavioral: Negative for substance abuse.    EKGs/Labs/Other Studies Reviewed:    The following studies were reviewed today: No cardiac symptoms  EKG:  EKG is  ordered today.  The ekg ordered today demonstrates NSR with LAD, HR 65  Recent Labs: No results found for requested labs within last 8760 hours.  Recent Lipid Panel    Component Value Date/Time   CHOL 201 (H) 11/30/2020 0950   TRIG 86.0 11/30/2020 0950   HDL 40.70 11/30/2020 0950   CHOLHDL 5 11/30/2020 0950   VLDL 17.2 11/30/2020 0950   LDLCALC 143 (H) 11/30/2020 0950     Risk Assessment/Calculations:       Physical Exam:    VS:  BP 114/74   Pulse 65   Ht 5\' 9"  (1.753 m)   Wt 213 lb 9.6 oz (96.9 kg)   SpO2 95%   BMI 31.54 kg/m     Wt Readings from Last 3 Encounters:  01/01/21 213 lb 9.6 oz (96.9 kg)  11/30/20 218 lb (98.9 kg)  05/22/19 218 lb (98.9 kg)     GEN:  Well nourished, well developed in no acute distress HEENT: Normal NECK: No JVD; No carotid bruits CARDIAC: RRR, no murmurs, rubs, gallops RESPIRATORY:  Clear to auscultation without rales, wheezing or rhonchi  ABDOMEN: Soft, non-tender, non-distended MUSCULOSKELETAL:  No edema; No deformity  SKIN: Warm and dry NEUROLOGIC:  Alert and oriented x 3 PSYCHIATRIC:  Normal affect   ASSESSMENT:    1. Dyspnea on exertion   2. Chest pain, unspecified type   3. Hyperlipidemia, unspecified hyperlipidemia type   4. Smoking history   5. Other fatigue    PLAN:    In order of problems listed above:  #Progressive Fatigue: #Chest Pain: #DOE: Patient with progressive fatigue over the past  year. Has noticed decreased exercise capacity and dyspnea on exertion as well. Also with occasional sharp chest pain that is not exertional in nature. While the chest pain sounds atypical, the progressive fatigue is concerning for possible cardiac etiology. Has strong history of polysubstance abuse, family history of CAD/CHF, and current tobacco use. Will pursue ischemic work-up with coronary CTA. -Check coronary CTA. -Check TTE -Start crestor 10mg  daily -Lifestyle modifications with exercise and diet discussed at length below  #Tobacco Abuse: Patient with history of tobacco abuse with current 1ppd use. Wants to quit but having difficulty. Did not tolerate wellbutrin. -Start nicotine patches  #HLD: LDL 143.  Given tobacco use and risk factors, will start crestor -Start crestor 10mg  daily   Exercise recommendations: Goal of exercising for at least 30 minutes a day, at least 5 times per week.  Please exercise to a moderate exertion.  This means that while exercising it is difficult to speak in full sentences, however you are not so short of breath that you feel you must stop, and not so comfortable that you can carry on a full conversation.  Exertion level should be approximately a 5/10, if 10 is the most exertion you can perform.  Diet recommendations: Recommend a heart healthy diet such as the Mediterranean diet.  This diet consists of plant based foods, healthy fats, lean meats, olive oil.  It suggests limiting the intake of simple carbohydrates such as white breads, pastries, and pastas.  It also limits the amount of red meat, wine, and dairy products such as cheese that one should consume on a daily basis.   Medication Adjustments/Labs and Tests Ordered: Current medicines are reviewed at length with the patient today.  Concerns regarding medicines are outlined above.  Orders Placed This Encounter  Procedures  . CT CORONARY MORPH W/CTA COR W/SCORE W/CA W/CM &/OR WO/CM  . CT CORONARY  FRACTIONAL FLOW RESERVE DATA PREP  . CT CORONARY FRACTIONAL FLOW RESERVE FLUID ANALYSIS  . EKG 12-Lead  . ECHOCARDIOGRAM COMPLETE   Meds ordered this encounter  Medications  . rosuvastatin (CRESTOR) 10 MG tablet    Sig: Take 1 tablet (10 mg total) by mouth daily.    Dispense:  90 tablet    Refill:  3  . nicotine (NICODERM CQ) 21 mg/24hr patch    Sig: Place 1 patch (21 mg total) onto the skin daily.    Dispense:  28 patch    Refill:  2  . metoprolol tartrate (LOPRESSOR) 50 MG tablet    Sig: Take 1 tablet (50 mg total) by mouth once for 1 dose. Take 1 tablet 2 hours prior to procedure    Dispense:  1 tablet    Refill:  0    Patient Instructions   Medication Instructions:  Start Crestor 10 mg daily Nicoderm patches 21 mg daily *If you need a refill on your cardiac medications before your next appointment, please call your pharmacy*   Lab Work: BMET prior to Coronary CTA FFR If you have labs (blood work) drawn today and your tests are completely normal, you will receive your results only by: Marland Kitchen. MyChart Message (if you have MyChart) OR . A paper copy in the mail If you have any lab test that is abnormal or we need to change your treatment, we will call you to review the results.   Testing/Procedures: Your physician has requested that you have an echocardiogram. Echocardiography is a painless test that uses sound waves to create images of your heart. It provides your doctor with information about the size and shape of your heart and how well your heart's chambers and valves are working. This procedure takes approximately one hour. There are no restrictions for this procedure.  Your cardiac CT will be scheduled at one of the below locations:   [facility] [address] [place], [place] [address] [phone]  OR  [facility] [address] [place], [place] [address] [phone]  If scheduled at [facility], please [facility]entrance A[facility] of [facility] 30 minutes prior to test start time.  Proceed to the [facility] Radiology Department (first floor) to check-in and test prep.  If scheduled at [facility], please arrive 15 mins early for check-in and test prep.  Please follow these instructions carefully (unless otherwise directed):  Hold all erectile dysfunction medications at least 3 days (72 hrs) prior to test.  On the Night Before the Test: . Be sure to Drink plenty of water. . Do not consume any caffeinated/decaffeinated beverages or chocolate 12 hours prior to your test. . Do not take any antihistamines 12 hours prior to your test. . If the patient has contrast allergy: ? Patient will need a prescription for Prednisone and very clear instructions (as follows): 1. Prednisone 50 mg - take 13 hours prior to test 2. Take another Prednisone 50 mg 7 hours prior to test 3. Take another Prednisone 50 mg 1 hour prior to test 4. Take Benadryl 50 mg 1 hour prior to test . Patient must complete all four doses of above prophylactic medications. . Patient will need a ride after test due to Benadryl.  On the Day of the Test: . Drink plenty of water until 1 hour prior to the test. . Do not eat any food 4 hours prior to the test. . You may take your regular medications prior to the test.  . Take metoprolol (Lopressor) two hours prior to test. . HOLD Furosemide/Hydrochlorothiazide morning of the test. . FEMALES- please wear underwire-free bra if available   *For Clinical Staff only. Please instruct patient the following:*        -Drink plenty of water       -Hold Furosemide/hydrochlorothiazide morning of the test       -Take metoprolol (Lopressor) 2 hours prior to test (if applicable).                  -If HR is less than 55 BPM- No Lopressor                -IF HR is greater than 55 BPM and patient is less than or equal to  65 yrs old Lopressor  x1.                -If HR is greater than 55 BPM and patient is greater than 4 yrs old Lopressor 50 mg x1.     Do not give Lopressor to patients with an allergy to lopressor or anyone with asthma or active COPD symptoms (currently taking steroids).       After the Test: . Drink plenty of water. . After receiving IV contrast, you may experience a mild flushed feeling. This is normal. . On occasion, you may experience a mild rash up to 24 hours after the test. This is not dangerous. If this occurs, you can take Benadryl 25 mg and increase your fluid intake. . If you experience trouble breathing, this can be serious. If it is severe call 911 IMMEDIATELY. If it is mild, please call our office. . If you take any of these medications: Glipizide/Metformin, Avandament, Glucavance, please do not take 48 hours after completing test unless otherwise instructed.   Once we have confirmed authorization from your insurance company, we will call you to set up a date and time for your test. Based on how quickly your insurance processes prior authorizations requests, please allow up to 4 weeks to be contacted for scheduling your Cardiac CT appointment. Be advised that routine Cardiac CT appointments could be scheduled as many as 8 weeks  after your provider has ordered it.  For non-scheduling related questions, please contact the cardiac imaging nurse navigator should you have any questions/concerns: Rockwell Alexandria, Cardiac Imaging Nurse Navigator Larey Brick, Cardiac Imaging Nurse Navigator [facility] Direct Office Dial: [phone]   For scheduling needs, including cancellations and rescheduling, please call [place], [phone].   Mediterranean Diet A Mediterranean diet refers to food and lifestyle choices that are based on the traditions of countries located on the [place]. This way of eating has been shown to help prevent certain  conditions and improve outcomes for people who have chronic diseases, like kidney disease and heart disease. What are tips for following this plan? Lifestyle  Cook and eat meals together with your family, when possible.  Drink enough fluid to keep your urine clear or pale yellow.  Be physically active every day. This includes: ? Aerobic exercise like running or swimming. ? Leisure activities like gardening, walking, or housework.  Get 7-8 hours of sleep each night.  If recommended by your health care provider, drink red wine in moderation. This means 1 glass a day for nonpregnant women and 2 glasses a day for men. A glass of wine equals 5 oz (150 mL). Reading food labels  Check the serving size of packaged foods. For foods such as rice and pasta, the serving size refers to the amount of cooked product, not dry.  Check the total fat in packaged foods. Avoid foods that have saturated fat or trans fats.  Check the ingredients list for added sugars, such as corn syrup.   Shopping  At the grocery store, buy most of your food from the areas near the walls of the store. This includes: ? Fresh fruits and vegetables (produce). ? Grains, beans, nuts, and seeds. Some of these may be available in unpackaged forms or large amounts (in bulk). ? Fresh seafood. ? Poultry and eggs. ? Low-fat dairy products.  Buy whole ingredients instead of prepackaged foods.  Buy fresh fruits and vegetables in-season from local farmers markets.  Buy frozen fruits and vegetables in resealable bags.  If you do not have access to quality fresh seafood, buy precooked frozen shrimp or canned fish, such as tuna, salmon, or sardines.  Buy small amounts of raw or cooked vegetables, salads, or olives from the deli or salad bar at your store.  Stock your pantry so you always have certain foods on hand, such as olive oil, canned tuna, canned tomatoes, rice, pasta, and beans. Cooking  Cook foods with extra-virgin  olive oil instead of using butter or other vegetable oils.  Have meat as a side dish, and have vegetables or grains as your main dish. This means having meat in small portions or adding small amounts of meat to foods like pasta or stew.  Use beans or vegetables instead of meat in common dishes like chili or lasagna.  Experiment with different cooking methods. Try roasting or broiling vegetables instead of steaming or sauteing them.  Add frozen vegetables to soups, stews, pasta, or rice.  Add nuts or seeds for added healthy fat at each meal. You can add these to yogurt, salads, or vegetable dishes.  Marinate fish or vegetables using olive oil, lemon juice, garlic, and fresh herbs. Meal planning  Plan to eat 1 vegetarian meal one day each week. Try to work up to 2 vegetarian meals, if possible.  Eat seafood 2 or more times a week.  Have healthy snacks readily available, such as: ?  Vegetable sticks with hummus. ? [place] yogurt. ? Fruit and nut trail mix.  Eat balanced meals throughout the week. This includes: ? Fruit: 2-3 servings a day ? Vegetables: 4-5 servings a day ? Low-fat dairy: 2 servings a day ? Fish, poultry, or lean meat: 1 serving a day ? Beans and legumes: 2 or more servings a week ? Nuts and seeds: 1-2 servings a day ? Whole grains: 6-8 servings a day ? Extra-virgin olive oil: 3-4 servings a day  Limit red meat and sweets to only a few servings a month   What are my food choices?  Mediterranean diet ? Recommended  Grains: Whole-grain pasta. Brown rice. Bulgar wheat. Polenta. Couscous. Whole-wheat bread. Orpah Cobb.  Vegetables: Artichokes. Beets. Broccoli. Cabbage. Carrots. Eggplant. Green beans. Chard. Kale. Spinach. Onions. Leeks. Peas. Squash. Tomatoes. Peppers. Radishes.  Fruits: Apples. Apricots. Avocado. Berries. Bananas. Cherries. Dates. Figs. Grapes. Lemons. Melon. Oranges. Peaches. Plums. Pomegranate.  Meats and other protein foods: Beans.  Almonds. Sunflower seeds. Pine nuts. Peanuts. Cod. Salmon. Scallops. Shrimp. Tuna. Tilapia. Clams. Oysters. Eggs.  Dairy: Low-fat milk. Cheese. Greek yogurt.  Beverages: Water. Red wine. Herbal tea.  Fats and oils: Extra virgin olive oil. Avocado oil. Grape seed oil.  Sweets and desserts: [place] yogurt with honey. Baked apples. Poached pears. Trail mix.  Seasoning and other foods: Basil. Cilantro. Coriander. Cumin. Mint. Parsley. Sage. Rosemary. Tarragon. Garlic. Oregano. Thyme. Pepper. Balsalmic vinegar. Tahini. Hummus. Tomato sauce. Olives. Mushrooms. ? Limit these  Grains: Prepackaged pasta or rice dishes. Prepackaged cereal with added sugar.  Vegetables: Deep fried potatoes (french fries).  Fruits: Fruit canned in syrup.  Meats and other protein foods: Beef. Pork. Lamb. Poultry with skin. Hot dogs. Tomasa Blase.  Dairy: Ice cream. Sour cream. Whole milk.  Beverages: Juice. Sugar-sweetened soft drinks. Beer. Liquor and spirits.  Fats and oils: Butter. Canola oil. Vegetable oil. Beef fat (tallow). Lard.  Sweets and desserts: Cookies. Cakes. Pies. Candy.  Seasoning and other foods: Mayonnaise. Premade sauces and marinades. The items listed may not be a complete list. Talk with your dietitian about what dietary choices are right for you. Summary  The Mediterranean diet includes both food and lifestyle choices.  Eat a variety of fresh fruits and vegetables, beans, nuts, seeds, and whole grains.  Limit the amount of red meat and sweets that you eat.  Talk with your health care provider about whether it is safe for you to drink red wine in moderation. This means 1 glass a day for nonpregnant women and 2 glasses a day for men. A glass of wine equals 5 oz (150 mL). This information is not intended to replace advice given to you by your health care provider. Make sure you discuss any questions you have with your health care provider. Document Revised: 06/12/2016 Document Reviewed:  06/05/2016 Elsevier Patient Education  2020 [employer].    Follow-Up: At [facility], you and your health needs are our priority.  As part of our continuing mission to provide you with exceptional heart care, we have created designated Provider Care Teams.  These Care Teams include your primary Cardiologist (physician) and Advanced Practice Providers (APPs -  Physician Assistants and Nurse Practitioners) who all work together to provide you with the care you need, when you need it.  We recommend signing up for the patient portal called "MyChart".  Sign up information is provided on this After Visit Summary.  MyChart is used to connect with patients for Virtual Visits (Telemedicine).  Patients are able  to view lab/test results, encounter notes, upcoming appointments, etc.  Non-urgent messages can be sent to your provider as well.   To learn more about what you can do with MyChart, go to [email or web address].    Your next appointment:   3 month(s)  The format for your next appointment:   In Person  Provider:   You may see Dr. Laurance Flatten or one of the following Advanced Practice Providers on your designated Care Team:    Tereso Newcomer, PA-C  Chelsea Aus, [place]         Signed, Meriam Sprague, MD  01/01/2021 4:57 PM    [facility]

## 2021-01-01 ENCOUNTER — Other Ambulatory Visit: Payer: Self-pay

## 2021-01-01 ENCOUNTER — Ambulatory Visit (INDEPENDENT_AMBULATORY_CARE_PROVIDER_SITE_OTHER): Payer: No Typology Code available for payment source | Admitting: Cardiology

## 2021-01-01 ENCOUNTER — Encounter: Payer: Self-pay | Admitting: Cardiology

## 2021-01-01 VITALS — BP 114/74 | HR 65 | Ht 69.0 in | Wt 213.6 lb

## 2021-01-01 DIAGNOSIS — R0600 Dyspnea, unspecified: Secondary | ICD-10-CM

## 2021-01-01 DIAGNOSIS — E785 Hyperlipidemia, unspecified: Secondary | ICD-10-CM | POA: Diagnosis not present

## 2021-01-01 DIAGNOSIS — R079 Chest pain, unspecified: Secondary | ICD-10-CM | POA: Diagnosis not present

## 2021-01-01 DIAGNOSIS — Z87891 Personal history of nicotine dependence: Secondary | ICD-10-CM | POA: Diagnosis not present

## 2021-01-01 DIAGNOSIS — R0609 Other forms of dyspnea: Secondary | ICD-10-CM

## 2021-01-01 DIAGNOSIS — R5383 Other fatigue: Secondary | ICD-10-CM

## 2021-01-01 MED ORDER — NICOTINE 21 MG/24HR TD PT24: 21.0000 mg | MEDICATED_PATCH | Freq: Every day | TRANSDERMAL | 2 refills | Status: DC

## 2021-01-01 MED ORDER — ROSUVASTATIN CALCIUM 10 MG PO TABS: 10.0000 mg | ORAL_TABLET | Freq: Every day | ORAL | 3 refills | Status: DC

## 2021-01-01 MED ORDER — METOPROLOL TARTRATE 50 MG PO TABS: 50.0000 mg | ORAL_TABLET | Freq: Once | ORAL | 0 refills | Status: DC

## 2021-01-01 NOTE — Patient Instructions (Signed)
Medication Instructions:  Start Crestor 10 mg daily Nicoderm patches 21 mg daily *If you need a refill on your cardiac medications before your next appointment, please call your pharmacy*   Lab Work: BMET prior to Coronary CTA FFR If you have labs (blood work) drawn today and your tests are completely normal, you will receive your results only by: Marland Kitchen MyChart Message (if you have MyChart) OR . A paper copy in the mail If you have any lab test that is abnormal or we need to change your treatment, we will call you to review the results.   Testing/Procedures: Your physician has requested that you have an echocardiogram. Echocardiography is a painless test that uses sound waves to create images of your heart. It provides your doctor with information about the size and shape of your heart and how well your heart's chambers and valves are working. This procedure takes approximately one hour. There are no restrictions for this procedure.  Your cardiac CT will be scheduled at one of the below locations:   [facility] [address] [place], [place] [address] [phone]  OR  [facility] [address] [place], [place] [address] [phone]  If scheduled at [facility], please arrive at the [facility] main entrance (entrance A) of [facility] 30 minutes prior to test start time. Proceed to the [facility] Radiology Department (first floor) to check-in and test prep.  If scheduled at [facility], please arrive 15 mins early for check-in and test prep.  Please follow these instructions carefully (unless otherwise directed):  Hold all erectile dysfunction medications at least 3 days (72 hrs) prior to test.  On the Night Before the Test: . Be sure to Drink plenty of water. . Do not consume any caffeinated/decaffeinated beverages or chocolate 12 hours prior to your test. . Do not take any  antihistamines 12 hours prior to your test. . If the patient has contrast allergy: ? Patient will need a prescription for Prednisone and very clear instructions (as follows): 1. Prednisone 50 mg - take 13 hours prior to test 2. Take another Prednisone 50 mg 7 hours prior to test 3. Take another Prednisone 50 mg 1 hour prior to test 4. Take Benadryl 50 mg 1 hour prior to test . Patient must complete all four doses of above prophylactic medications. . Patient will need a ride after test due to Benadryl.  On the Day of the Test: . Drink plenty of water until 1 hour prior to the test. . Do not eat any food 4 hours prior to the test. . You may take your regular medications prior to the test.  . Take metoprolol (Lopressor) two hours prior to test. . HOLD Furosemide/Hydrochlorothiazide morning of the test. . FEMALES- please wear underwire-free bra if available   *For Clinical Staff only. Please instruct patient the following:*        -Drink plenty of water       -Hold Furosemide/hydrochlorothiazide morning of the test       -Take metoprolol (Lopressor) 2 hours prior to test (if applicable).                  -If HR is less than 55 BPM- No Lopressor                -IF HR is greater than 55 BPM and patient is less than or equal to 72 yrs old Lopressor 100mg   x1.                -If HR is greater than 55 BPM and patient is greater than 2 yrs old Lopressor 50 mg x1.     Do not give Lopressor to patients with an allergy to lopressor or anyone with asthma or active COPD symptoms (currently taking steroids).       After the Test: . Drink plenty of water. . After receiving IV contrast, you may experience a mild flushed feeling. This is normal. . On occasion, you may experience a mild rash up to 24 hours after the test. This is not dangerous. If this occurs, you can take Benadryl 25 mg and increase your fluid intake. . If you experience trouble breathing, this can be serious. If it is severe call  [phone] IMMEDIATELY. If it is mild, please call our office. . If you take any of these medications: Glipizide/Metformin, Avandament, Glucavance, please do not take 48 hours after completing test unless otherwise instructed.   Once we have confirmed authorization from your insurance company, we will call you to set up a date and time for your test. Based on how quickly your insurance processes prior authorizations requests, please allow up to 4 weeks to be contacted for scheduling your Cardiac CT appointment. Be advised that routine Cardiac CT appointments could be scheduled as many as 8 weeks after your provider has ordered it.  For non-scheduling related questions, please contact the cardiac imaging nurse navigator should you have any questions/concerns: Rockwell Alexandria, Cardiac Imaging Nurse Navigator Larey Brick, Cardiac Imaging Nurse Navigator [facility] Direct Office Dial: [phone]   For scheduling needs, including cancellations and rescheduling, please call [place], [phone].   Mediterranean Diet A Mediterranean diet refers to food and lifestyle choices that are based on the traditions of countries located on the [place]. This way of eating has been shown to help prevent certain conditions and improve outcomes for people who have chronic diseases, like kidney disease and heart disease. What are tips for following this plan? Lifestyle  Cook and eat meals together with your family, when possible.  Drink enough fluid to keep your urine clear or pale yellow.  Be physically active every day. This includes: ? Aerobic exercise like running or swimming. ? Leisure activities like gardening, walking, or housework.  Get 7-8 hours of sleep each night.  If recommended by your health care provider, drink red wine in moderation. This means 1 glass a day for nonpregnant women and 2 glasses a day for men. A glass of wine equals 5 oz (150 mL). Reading  food labels  Check the serving size of packaged foods. For foods such as rice and pasta, the serving size refers to the amount of cooked product, not dry.  Check the total fat in packaged foods. Avoid foods that have saturated fat or trans fats.  Check the ingredients list for added sugars, such as corn syrup.   Shopping  At the grocery store, buy most of your food from the areas near the walls of the store. This includes: ? Fresh fruits and vegetables (produce). ? Grains, beans, nuts, and seeds. Some of these may be available in unpackaged forms or large amounts (in bulk). ? Fresh seafood. ? Poultry and eggs. ? Low-fat dairy products.  Buy whole ingredients instead of prepackaged foods.  Buy fresh fruits and vegetables in-season from local farmers markets.  Buy frozen fruits and vegetables in resealable bags.  If  you do not have access to quality fresh seafood, buy precooked frozen shrimp or canned fish, such as tuna, salmon, or sardines.  Buy small amounts of raw or cooked vegetables, salads, or olives from the deli or salad bar at your store.  Stock your pantry so you always have certain foods on hand, such as olive oil, canned tuna, canned tomatoes, rice, pasta, and beans. Cooking  Cook foods with extra-virgin olive oil instead of using butter or other vegetable oils.  Have meat as a side dish, and have vegetables or grains as your main dish. This means having meat in small portions or adding small amounts of meat to foods like pasta or stew.  Use beans or vegetables instead of meat in common dishes like chili or lasagna.  Experiment with different cooking methods. Try roasting or broiling vegetables instead of steaming or sauteing them.  Add frozen vegetables to soups, stews, pasta, or rice.  Add nuts or seeds for added healthy fat at each meal. You can add these to yogurt, salads, or vegetable dishes.  Marinate fish or vegetables using olive oil, lemon juice, garlic,  and fresh herbs. Meal planning  Plan to eat 1 vegetarian meal one day each week. Try to work up to 2 vegetarian meals, if possible.  Eat seafood 2 or more times a week.  Have healthy snacks readily available, such as: ? Vegetable sticks with hummus. ? [place] yogurt. ? Fruit and nut trail mix.  Eat balanced meals throughout the week. This includes: ? Fruit: 2-3 servings a day ? Vegetables: 4-5 servings a day ? Low-fat dairy: 2 servings a day ? Fish, poultry, or lean meat: 1 serving a day ? Beans and legumes: 2 or more servings a week ? Nuts and seeds: 1-2 servings a day ? Whole grains: 6-8 servings a day ? Extra-virgin olive oil: 3-4 servings a day  Limit red meat and sweets to only a few servings a month   What are my food choices?  Mediterranean diet ? Recommended  Grains: Whole-grain pasta. Brown rice. Bulgar wheat. Polenta. Couscous. Whole-wheat bread. Orpah Cobbatmeal. Quinoa.  Vegetables: Artichokes. Beets. Broccoli. Cabbage. Carrots. Eggplant. Green beans. Chard. Kale. Spinach. Onions. Leeks. Peas. Squash. Tomatoes. Peppers. Radishes.  Fruits: Apples. Apricots. Avocado. Berries. Bananas. Cherries. Dates. Figs. Grapes. Lemons. Melon. Oranges. Peaches. Plums. Pomegranate.  Meats and other protein foods: Beans. Almonds. Sunflower seeds. Pine nuts. Peanuts. Cod. Salmon. Scallops. Shrimp. Tuna. Tilapia. Clams. Oysters. Eggs.  Dairy: Low-fat milk. Cheese. Greek yogurt.  Beverages: Water. Red wine. Herbal tea.  Fats and oils: Extra virgin olive oil. Avocado oil. Grape seed oil.  Sweets and desserts: [place] yogurt with honey. Baked apples. Poached pears. Trail mix.  Seasoning and other foods: Basil. Cilantro. Coriander. Cumin. Mint. Parsley. Sage. Rosemary. Tarragon. Garlic. Oregano. Thyme. Pepper. Balsalmic vinegar. Tahini. Hummus. Tomato sauce. Olives. Mushrooms. ? Limit these  Grains: Prepackaged pasta or rice dishes. Prepackaged cereal with added sugar.  Vegetables: Deep fried  potatoes (french fries).  Fruits: Fruit canned in syrup.  Meats and other protein foods: Beef. Pork. Lamb. Poultry with skin. Hot dogs. Tomasa BlaseBacon.  Dairy: Ice cream. Sour cream. Whole milk.  Beverages: Juice. Sugar-sweetened soft drinks. Beer. Liquor and spirits.  Fats and oils: Butter. Canola oil. Vegetable oil. Beef fat (tallow). Lard.  Sweets and desserts: Cookies. Cakes. Pies. Candy.  Seasoning and other foods: Mayonnaise. Premade sauces and marinades. The items listed may not be a complete list. Talk with your dietitian about what dietary choices are right for  you. Summary  The Mediterranean diet includes both food and lifestyle choices.  Eat a variety of fresh fruits and vegetables, beans, nuts, seeds, and whole grains.  Limit the amount of red meat and sweets that you eat.  Talk with your health care provider about whether it is safe for you to drink red wine in moderation. This means 1 glass a day for nonpregnant women and 2 glasses a day for men. A glass of wine equals 5 oz (150 mL). This information is not intended to replace advice given to you by your health care provider. Make sure you discuss any questions you have with your health care provider. Document Revised: 06/12/2016 Document Reviewed: 06/05/2016 Elsevier Patient Education  2020 [employer].    Follow-Up: At [facility], you and your health needs are our priority.  As part of our continuing mission to provide you with exceptional heart care, we have created designated Provider Care Teams.  These Care Teams include your primary Cardiologist (physician) and Advanced Practice Providers (APPs -  Physician Assistants and Nurse Practitioners) who all work together to provide you with the care you need, when you need it.  We recommend signing up for the patient portal called "MyChart".  Sign up information is provided on this After Visit Summary.  MyChart is used to connect with patients for Virtual Visits  (Telemedicine).  Patients are able to view lab/test results, encounter notes, upcoming appointments, etc.  Non-urgent messages can be sent to your provider as well.   To learn more about what you can do with MyChart, go to [email or web address].    Your next appointment:   3 month(s)  The format for your next appointment:   In Person  Provider:   You may see Dr. Laurance Flatten or one of the following Advanced Practice Providers on your designated Care Team:    Tereso Newcomer, PA-C  [place], [place]

## 2021-01-24 ENCOUNTER — Other Ambulatory Visit (HOSPITAL_COMMUNITY): Payer: No Typology Code available for payment source

## 2021-02-18 ENCOUNTER — Telehealth (HOSPITAL_COMMUNITY): Payer: Self-pay | Admitting: Emergency Medicine

## 2021-02-18 ENCOUNTER — Encounter (HOSPITAL_COMMUNITY): Payer: Self-pay

## 2021-02-18 NOTE — Telephone Encounter (Signed)
Reaching out to patient to offer assistance regarding upcoming cardiac imaging study; pt verbalizes understanding of appt date/time, parking situation and where to check in, pre-test NPO status and medications ordered, and verified current allergies; name and call back number provided for further questions should they arise Rockwell Alexandria RN Navigator Cardiac Imaging [facility] [phone] office [phone] cell  50mg  metop 2 hr prior to scan 

## 2021-02-20 ENCOUNTER — Encounter (HOSPITAL_COMMUNITY): Payer: Self-pay

## 2021-02-20 ENCOUNTER — Ambulatory Visit (HOSPITAL_COMMUNITY)
Admission: RE | Admit: 2021-02-20 | Discharge: 2021-02-20 | Disposition: A | Discharge status: Home / Self Care | Payer: No Typology Code available for payment source | Source: Ambulatory Visit | Attending: Cardiology | Admitting: Cardiology

## 2021-02-20 ENCOUNTER — Other Ambulatory Visit: Payer: Self-pay

## 2021-02-20 ENCOUNTER — Ambulatory Visit (HOSPITAL_BASED_OUTPATIENT_CLINIC_OR_DEPARTMENT_OTHER): Payer: No Typology Code available for payment source

## 2021-02-20 DIAGNOSIS — R079 Chest pain, unspecified: Secondary | ICD-10-CM

## 2021-02-20 LAB — ECHOCARDIOGRAM COMPLETE
Area-P 1/2: 3.7 cm2
S' Lateral: 3 cm

## 2021-02-20 MED ORDER — NITROGLYCERIN 0.4 MG SL SUBL
0.8000 mg | SUBLINGUAL_TABLET | Freq: Once | SUBLINGUAL | Status: AC
  Administered 2021-02-20: 0.8 mg via SUBLINGUAL

## 2021-02-20 MED ORDER — IOHEXOL 350 MG/ML SOLN
95.0000 mL | Freq: Once | INTRAVENOUS | Status: AC | PRN
  Administered 2021-02-20: 95 mL via INTRAVENOUS

## 2021-02-20 MED ORDER — NITROGLYCERIN 0.4 MG SL SUBL
SUBLINGUAL_TABLET | SUBLINGUAL | Status: AC
  Filled 2021-02-20: qty 2

## 2021-02-20 NOTE — Progress Notes (Signed)
Patient tolerated CT well. Drank water after. Vital signs stable encourage to drink water throughout day.Reasons explained and verbalized understanding. Ambulated steady gait.  

## 2021-02-22 ENCOUNTER — Ambulatory Visit (HOSPITAL_COMMUNITY): Payer: No Typology Code available for payment source

## 2021-03-25 NOTE — Progress Notes (Deleted)
Cardiology Office Note:    Date:  03/25/2021   ID:  Larry Taylor, DOB 12-05-1970, MRN [id number]  PCP:  Loyola Mast, MD   [facility]  Cardiologist:  None  Advanced Practice Provider:  No care team member to display Electrophysiologist:  None    Referring MD: Loyola Mast, MD    History of Present Illness:    Larry Taylor is a 50 y.o. male with a hx of tobacco abuse and prior polysubstance abuse (quit in 2007) who presents to clinic for follow-up of epigastric pain and lack of energy.  Was seen by Dr. Veto Kemps on 11/30/20 where he was complaining of lack of energy and sharp epigastric pain. Has history of a crush chest injury and was told he may have lifelong chest pain, but the patient was also concerned that he may have underlying heart disease as well. He is a current 1ppd smoker.  Last seen in clinic 01/01/21 for fatigue and DOE. TTE 02/20/21 with LVEF 62%, normal RV, no significant valve disease. Coronary CTA 02/20/21 showed no coronary disease and calcium score 0.   Father with CHF in his 93s (passed away)  Past Medical History:  Diagnosis Date  . Heart murmur    per patient diagnosed at 50 years old    No past surgical history on file.  Current Medications: No outpatient medications have been marked as taking for the 03/29/21 encounter (Appointment) with Meriam Sprague, MD.     Allergies:   Patient has no known allergies.   Social History   Socioeconomic History  . Marital status: Married    Spouse name: Not on file  . Number of children: Not on file  . Years of education: Not on file  . Highest education level: Not on file  Occupational History  . Occupation: Driver    Comment: [employer]  Tobacco Use  . Smoking status: Current Every Day Smoker    Packs/day: 1.00    Years: 30.00    Pack years: 30.00    Types: Cigarettes  . Smokeless tobacco: Never Used  Vaping Use  . Vaping Use: Never used  Substance and Sexual  Activity  . Alcohol use: No    Comment: Past abuse of beer (1 case a day) quit in 2009.  . Drug use: Not Currently    Types: "Crack" cocaine    Comment: Opioids- Quit after rehab in 2009.  Marland Kitchen Sexual activity: Not on file  Other Topics Concern  . Not on file  Social History Narrative  . Not on file   Social Determinants of Health   Financial Resource Strain: Not on file  Food Insecurity: Not on file  Transportation Needs: Not on file  Physical Activity: Not on file  Stress: Not on file  Social Connections: Not on file     Family History: The patient's family history includes Cancer in his brother, father, mother, and sister; Heart disease in his father; High Cholesterol in his father; Hyperlipidemia in his mother; Stroke in his father.  ROS:   Please see the history of present illness.    Review of Systems  Constitutional: Positive for malaise/fatigue. Negative for chills and fever.  HENT: Negative for hearing loss and sore throat.   Eyes: Negative for blurred vision and redness.  Respiratory: Positive for shortness of breath.   Cardiovascular: Positive for chest pain. Negative for palpitations, orthopnea, claudication, leg swelling and PND.  Gastrointestinal: Negative for melena, nausea and vomiting.  Genitourinary: Negative for dysuria and flank pain.  Musculoskeletal: Positive for joint pain and myalgias. Negative for falls.  Neurological: Negative for dizziness and loss of consciousness.  Endo/Heme/Allergies: Negative for polydipsia.  Psychiatric/Behavioral: Negative for substance abuse.    EKGs/Labs/Other Studies Reviewed:    The following studies were reviewed today: TTE Mar 11, 2021: IMPRESSIONS    1. Left ventricular ejection fraction by 3D volume is 62 %. The left  ventricle has normal function. The left ventricle has no regional wall  motion abnormalities. Left ventricular diastolic parameters were normal.  2. Right ventricular systolic function is normal. The  right ventricular  size is normal. There is normal pulmonary artery systolic pressure.  3. The mitral valve is normal in structure. Trivial mitral valve  regurgitation. No evidence of mitral stenosis.  4. The aortic valve is tricuspid. Aortic valve regurgitation is not  visualized. No aortic stenosis is present.  5. The inferior vena cava is normal in size with greater than 50%  respiratory variability, suggesting right atrial pressure of 3 mmHg.   Coronary CTA 03/11/2021: FINDINGS: A 120 kV prospective scan was triggered in the descending thoracic aorta at 111 HU's. Axial non-contrast 3 mm slices were carried out through the heart. The data set was analyzed on a dedicated work station and scored using the Agatson method. Gantry rotation speed was 250 msecs and collimation was .6 mm. No beta blockade and 0.8 mg of sl NTG was given. The 3D data set was reconstructed in 5% intervals of the 67-82 % of the R-R cycle. Diastolic phases were analyzed on a dedicated work station using MPR, MIP and VRT modes. The patient received 80 cc of contrast.  Aorta:  Normal size.  No calcifications.  No dissection.  Aortic Valve:  Trileaflet.  No calcifications.  Coronary Arteries:  Normal coronary origin.  Right dominance.  RCA is a large dominant artery that gives rise to PDA and PLVB. There is no plaque.  Left main is a large artery that gives rise to LAD, Ramus and LCX arteries. There is no plaque.  LAD is a large vessel that gives rise to a small D1. There is no plaque.  Ramus is a small vessel with no plaque.  LCX is a non-dominant artery that gives rise to one large OM1 branch. There is no plaque.  Other findings:  Normal pulmonary vein drainage into the left atrium.  Normal let atrial appendage without a thrombus.  Normal size of the pulmonary artery.  IMPRESSION: 1. Coronary calcium score of 0. This was 0 percentile for age and sex matched control.  2.   Normal coronary origin with right dominance.  3.  No evidence of CAD.  CAD RADS 0.  4.  Consider non atherosclerotic causes of chest pain.  EKG:  ***  Recent Labs: No results found for requested labs within last 8760 hours.  Recent Lipid Panel    Component Value Date/Time   CHOL 201 (H) 11/30/2020 0950   TRIG 86.0 11/30/2020 0950   HDL 40.70 11/30/2020 0950   CHOLHDL 5 11/30/2020 0950   VLDL 17.2 11/30/2020 0950   LDLCALC 143 (H) 11/30/2020 0950     Risk Assessment/Calculations:       Physical Exam:    VS:  There were no vitals taken for this visit.    Wt Readings from Last 3 Encounters:  01/01/21 213 lb 9.6 oz (96.9 kg)  11/30/20 218 lb (98.9 kg)  05/22/19 218 lb (98.9 kg)     GEN:  Well nourished, well developed in no acute distress HEENT: Normal NECK: No JVD; No carotid bruits CARDIAC: RRR, no murmurs, rubs, gallops RESPIRATORY:  Clear to auscultation without rales, wheezing or rhonchi  ABDOMEN: Soft, non-tender, non-distended MUSCULOSKELETAL:  No edema; No deformity  SKIN: Warm and dry NEUROLOGIC:  Alert and oriented x 3 PSYCHIATRIC:  Normal affect   ASSESSMENT:    No diagnosis found. PLAN:    In order of problems listed above:  #Non-cardiac chest pain: Cardiac work-up reassuring. Coronary CTA negative for obstructive CAD. Calcium score 0. -Continue crestor 10mg  daily -Lifestyle modifications with exercise and diet discussed at length below  #Tobacco Abuse: Patient with history of tobacco abuse with current 1ppd use. Wants to quit but having difficulty. Did not tolerate wellbutrin. -Continue nicotine patches  #HLD: -Continue crestor 10mg  daily   Exercise recommendations: Goal of exercising for at least 30 minutes a day, at least 5 times per week.  Please exercise to a moderate exertion.  This means that while exercising it is difficult to speak in full sentences, however you are not so short of breath that you feel you must stop, and not so  comfortable that you can carry on a full conversation.  Exertion level should be approximately a 5/10, if 10 is the most exertion you can perform.  Diet recommendations: Recommend a heart healthy diet such as the Mediterranean diet.  This diet consists of plant based foods, healthy fats, lean meats, olive oil.  It suggests limiting the intake of simple carbohydrates such as white breads, pastries, and pastas.  It also limits the amount of red meat, wine, and dairy products such as cheese that one should consume on a daily basis.   Medication Adjustments/Labs and Tests Ordered: Current medicines are reviewed at length with the patient today.  Concerns regarding medicines are outlined above.  No orders of the defined types were placed in this encounter.  No orders of the defined types were placed in this encounter.   There are no Patient Instructions on file for this visit.   Signed, , MD  03/25/2021 4:03 PM    [facility]

## 2021-03-29 ENCOUNTER — Ambulatory Visit: Payer: No Typology Code available for payment source | Admitting: Cardiology

## 2021-04-17 ENCOUNTER — Encounter: Payer: Self-pay | Admitting: Family Medicine

## 2021-04-19 ENCOUNTER — Other Ambulatory Visit: Payer: Self-pay

## 2021-04-19 ENCOUNTER — Ambulatory Visit: Payer: 59 | Admitting: Family Medicine

## 2021-04-19 ENCOUNTER — Encounter: Payer: Self-pay | Admitting: Family Medicine

## 2021-04-19 VITALS — BP 124/78 | HR 86 | Temp 97.3°F | Ht 69.0 in | Wt 208.6 lb

## 2021-04-19 DIAGNOSIS — Z91030 Bee allergy status: Secondary | ICD-10-CM | POA: Diagnosis not present

## 2021-04-19 DIAGNOSIS — R079 Chest pain, unspecified: Secondary | ICD-10-CM | POA: Diagnosis not present

## 2021-04-19 MED ORDER — EPINEPHRINE 0.3 MG/0.3ML IJ SOAJ: 0.3000 mg | INTRAMUSCULAR | 3 refills | Status: DC | PRN

## 2021-04-19 NOTE — Progress Notes (Signed)
[facility] [address] [place] [address] Dept: [phone] Dept Fax: [phone]  Office Visit  Subjective:    Patient ID: Larry Taylor, male    DOB: 09/14/1971, 50 y.o..   MRN: [id number]  Chief Complaint  Patient presents with   Follow-up    Wants to get Epi pen due to being allergic to bees.      History of Present Illness:  Patient is in today to request an Epi-pen. Larry Taylor has recently started to work for [employer]. He has a history from his childhood of allergic reactions to bee stings. He recalls being stung in the wrist and his hand puffing up like a catcher's mitt. He had a bite/sting on the chin while camping on time, that caused facial swelling, including his tongue. 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  Since his last visit, Larry Taylor was seen by cardiology. He underwent evaluation for his atypical chest pain, the findings of which were normal. He feels the pains may have been related to shoulder issues. He notes some possible rotator cuff tendinitis.  Past Medical History: Patient Active Problem List   Diagnosis Date Noted   Bee sting allergy 04/19/2021   History of drug abuse in remission (HCC) 11/30/2020   Fatigue 04/20/2017   Tobacco use disorder, continuous 04/20/2017   Tobacco abuse counseling 04/20/2017   Incomplete right bundle branch block 11/03/2016   No past surgical history on file.  Family History  Problem Relation Age of Onset   Cancer Mother    Hyperlipidemia Mother    Cancer Father    Heart disease Father    Stroke Father    High Cholesterol Father    Cancer Sister    Cancer Brother    Outpatient Medications Prior to Visit  Medication Sig Dispense Refill   Ascorbic Acid (VITAMIN C) 1000 MG tablet Take 1,000 mg by mouth daily.     ibuprofen (ADVIL) 100 MG tablet Take 100 mg by mouth every 6 (six) hours as needed for fever.      loratadine-pseudoephedrine (CLARITIN-D 12-HOUR) 5-120 MG tablet Take 1 tablet by mouth 2 (two) times daily.     Omega-3 Fatty Acids (FISH OIL) 1000 MG CAPS Take by mouth.     omeprazole (PRILOSEC) 20 MG capsule Take 20 mg by mouth daily.     metoprolol tartrate (LOPRESSOR) 50 MG tablet Take 1 tablet (50 mg total) by mouth once for 1 dose. Take 1 tablet 2 hours prior to procedure 1 tablet 0   nicotine (NICODERM CQ) 21 mg/24hr patch Place 1 patch (21 mg total) onto the skin daily. 28 patch 2   rosuvastatin (CRESTOR) 10 MG tablet Take 1 tablet (10 mg total) by mouth daily. 90 tablet 3   No facility-administered medications prior to visit.   No Known Allergies    Objective:   Today's Vitals   04/19/21 0809  BP: 124/78  Pulse: 86  Temp: (!) 97.3 F (36.3 C)  TempSrc: Temporal  SpO2: 97%  Weight: 208 lb 9.6 oz (94.6 kg)  Height: 5\' 9"  (1.753 m)   Body mass index is 30.8 kg/m.   General: Well developed, well nourished. No acute distress. Psych: Alert and oriented. Normal mood and affect.  Health Maintenance Due  Topic Date Due   COVID-19 Vaccine (1) Never done   Pneumococcal Vaccine 11-26 Years old (1 - PCV) Never done   Hepatitis  C Screening  Never done   COLONOSCOPY (Pts 45-2yrs Insurance coverage will need to be confirmed)  Never done   Imaging: CT Coronary (02/20/2021) IMPRESSION: 1. Coronary calcium score of 0. This was 0 percentile for age and sex matched control. 2.  Normal coronary origin with right dominance. 3.  No evidence of CAD.  CAD RADS 0. 4.  Consider non atherosclerotic causes of chest pain.    Echocardiogram (02/20/2021) IMPRESSIONS   1. Left ventricular ejection fraction by 3D volume is 62 %. The left ventricle has normal function. The left ventricle has no regional wall motion abnormalities. Left ventricular diastolic parameters were normal.   2. Right ventricular systolic function is normal. The right ventricular size is normal. There is normal pulmonary  artery systolic pressure.   3. The mitral valve is normal in structure. Trivial mitral valve regurgitation. No evidence of mitral stenosis.   4. The aortic valve is tricuspid. Aortic valve regurgitation is not visualized. No aortic stenosis is present.   5. The inferior vena cava is normal in size with greater than 50% respiratory variability, suggesting right atrial pressure of 3 mmHg.   Assessment & Plan:   1. Bee sting allergy Recommend he keep Epi-pen available. I advised against leaving it in his vehicle, due to summertime heat. If having breathign difficulty, and/or tongue/throat swelling, inject epinephrine, take 50 mg of oral Benadryl, and go to ER. - EPINEPHrine 0.3 mg/0.3 mL IJ SOAJ injection; Inject 0.3 mg into the muscle as needed for anaphylaxis.  Dispense: 1 each; Refill: 3  2. Chest pain, unspecified type Cardiology work-up is benign and shows no cardiac source of chest pain. He should return for orthopedica assessment if shoudler issues become bothersome.  Loyola Mast, MD

## 2021-05-31 ENCOUNTER — Ambulatory Visit: Payer: No Typology Code available for payment source | Admitting: Family Medicine

## 2021-07-30 ENCOUNTER — Other Ambulatory Visit: Payer: Self-pay

## 2021-07-31 ENCOUNTER — Ambulatory Visit: Payer: 59 | Admitting: Family Medicine

## 2021-07-31 ENCOUNTER — Encounter: Payer: Self-pay | Admitting: Family Medicine

## 2021-07-31 VITALS — BP 138/80 | HR 68 | Temp 97.2°F | Ht 69.0 in | Wt 212.2 lb

## 2021-07-31 DIAGNOSIS — M7542 Impingement syndrome of left shoulder: Secondary | ICD-10-CM

## 2021-07-31 MED ORDER — MELOXICAM 7.5 MG PO TABS: 7.5000 mg | ORAL_TABLET | Freq: Every day | ORAL | 1 refills | Status: DC

## 2021-07-31 NOTE — Progress Notes (Signed)
[facility] [address] [place] [address] Dept: [phone] Dept Fax: [phone]  Office Visit  Subjective:    Patient ID: Larry Taylor, male    DOB: August 02, 1971, 49 y.o..   MRN: [id number]  Chief Complaint  Patient presents with   Acute Visit    C/o having LT shoulder pain x 6 months.  No OTC meds taken.   Declines flu shot.     History of Present Illness:  Patient is in today for complaints of left shoulder pain for the past 6 months. He notes that the pain bothers him most at night, particularly if he is relaxing with his left arm raised. He finds when he lowers the arm, he gets pain and stiffness. he is also noting pain during sleep, esp. with the arm above the head under the pillow. He has taken an occasional ibuprofen. Larry Taylor works for [employer]. He has a friend who told him about getting a cortisone injection in the shoulder. He also has heard of a product called Relief Factor and wonders if this would be good to take.  Past Medical History: Patient Active Problem List   Diagnosis Date Noted   Bee sting allergy 04/19/2021   History of drug abuse in remission (HCC) 11/30/2020   Fatigue 04/20/2017   Tobacco use disorder, continuous 04/20/2017   Tobacco abuse counseling 04/20/2017   Incomplete right bundle branch block 11/03/2016   No past surgical history on file.  Family History  Problem Relation Age of Onset   Cancer Mother    Hyperlipidemia Mother    Cancer Father    Heart disease Father    Stroke Father    High Cholesterol Father    Cancer Sister    Cancer Brother    Outpatient Medications Prior to Visit  Medication Sig Dispense Refill   Ascorbic Acid (VITAMIN C) 1000 MG tablet Take 1,000 mg by mouth daily.     EPINEPHrine 0.3 mg/0.3 mL IJ SOAJ injection Inject 0.3 mg into the muscle as needed for anaphylaxis. 1 each 3   ibuprofen (ADVIL) 100 MG tablet Take 100 mg by mouth every 6 (six) hours  as needed for fever.     loratadine-pseudoephedrine (CLARITIN-D 12-HOUR) 5-120 MG tablet Take 1 tablet by mouth 2 (two) times daily.     Omega-3 Fatty Acids (FISH OIL) 1000 MG CAPS Take by mouth.     omeprazole (PRILOSEC) 20 MG capsule Take 20 mg by mouth daily.     No facility-administered medications prior to visit.   No Known Allergies    Objective:   Today's Vitals   07/31/21 0835  BP: 138/80  Pulse: 68  Temp: (!) 97.2 F (36.2 C)  TempSrc: Temporal  SpO2: 96%  Weight: 212 lb 3.2 oz (96.3 kg)  Height: 5\' 9"  (1.753 m)   Body mass index is 31.34 kg/m.   General: Well developed, well nourished. No acute distress. Extremities: Mild limitation of ROM of the shoulder with abduction. No pain across the superior   shoulder. Pain indicated more over the supraspinatus area. No pain to palpation at the [facility] joint or   along the glenohumeral joint line. No joint subluxation. Increased pain, but no weakness with   resisted abduction/internal rotation. Psych: Alert and oriented. Normal mood and affect.  Health Maintenance Due  Topic Date Due   COVID-19 Vaccine (1) Never done   Hepatitis C Screening  Never done   Zoster Vaccines- Shingrix (1 of 2)  Never done   COLONOSCOPY (Pts 45-74yrs Insurance coverage will need to be confirmed)  Never done   INFLUENZA VACCINE  05/27/2021     Assessment & Plan:   1. Shoulder impingement syndrome, left Larry Taylor's symptoms and exam are consistent with left shoulder impingement. I recommend we start with conservative measures prior to considering a steroid injection. I reviewed anatomy and pathophysiology of the issue with the patient. I recommend moist heat with pendulum exercises for 5-10 min. bid. I recommend icing for acute flares. I will place him on Mobic for 2 weeks. If not improved, he should return to consider other therapies. We looked up the ingredients in Relief Factor. This does not appear harmful and may provide some marginal benefit. I  leave it up to him to decide if he wants to use this.  - meloxicam (MOBIC) 7.5 MG tablet; Take 1 tablet (7.5 mg total) by mouth daily.  Dispense: 14 tablet; Refill: 1  Loyola Mast, MD

## 2021-07-31 NOTE — Patient Instructions (Signed)
Recommend moist heat to shoulder with pendulum exercise for 5-10 minutes twice a day.  Rotator Cuff Tendinitis Rotator cuff tendinitis is inflammation of the tendons in the rotator cuff. Tendons are tough, cord-like bands that connect muscle to bone. The rotator cuff includes all of the muscles and tendons that connect the arm to the shoulder. The rotator cuff holds the head of the humerus, or the upper arm bone, in the cup of the shoulder blade (scapula). This condition can lead to a long-term or chronic tear. The tear may be partial or complete. What are the causes? This condition is usually caused by overusing the rotator cuff. What increases the risk? This condition is more likely to develop in athletes and workers who frequently use their shoulder or reach over their heads. This can include activities such as: Tennis. Baseball or softball. Swimming. Construction work. Painting. What are the signs or symptoms? Symptoms of this condition include: Pain that spreads (radiates) from the shoulder to the upper arm. Swelling and tenderness in front of the shoulder. Pain when reaching, pulling, or lifting the arm above the head. Pain when lowering the arm from above the head. Minor pain in the shoulder when resting. Increased pain in the shoulder at night. Difficulty placing the arm behind the back. How is this diagnosed? This condition is diagnosed with a physical exam and medical history. Tests may also be done, including: X-rays. MRI. Ultrasound. CT with or without contrast. How is this treated? Treatment for this condition depends on the severity of the condition. In less severe cases, treatment may include: Rest. This may be done with a sling that holds the shoulder still (immobilization). Your health care provider may also recommend avoiding activities that involve lifting your arm over your head. Icing the shoulder. Anti-inflammatory medicines, such as aspirin or ibuprofen. In  more severe cases, treatment may include: Physical therapy. Steroid injections. Surgery. Follow these instructions at home: If you have a sling: Wear the sling as told by your health care provider. Remove it only as told by your health care provider. Loosen it if your fingers tingle, become numb, or turn cold and blue. Keep it clean. If the sling is not waterproof: Do not let it get wet. Cover it with a watertight covering when you take a bath or shower. Managing pain, stiffness, and swelling  If directed, put ice on the injured area. To do this: If you have a removable sling, remove it as told by your health care provider. Put ice in a plastic bag. Place a towel between your skin and the bag. Leave the ice on for 20 minutes, 2-3 times a day. Move your fingers often to reduce stiffness and swelling. Raise (elevate) the injured area above the level of your heart while you are lying down. Find a comfortable sleeping position, or sleep in a recliner, if available. Activity Rest your shoulder as told by your health care provider. Ask your health care provider when it is safe to drive if you have a sling on your arm. Return to your normal activities as told by your health care provider. Ask your health care provider what activities are safe for you. Do any exercises or stretches as told by your health care provider or physical therapist. If you do repetitive overhead tasks, take small breaks in between and include stretching exercises as told by your health care provider. General instructions Do not use any products that contain nicotine or tobacco, such as cigarettes, e-cigarettes, and chewing tobacco.  These can delay healing. If you need help quitting, ask your health care provider. Take over-the-counter and prescription medicines only as told by your health care provider. Keep all follow-up visits as told by your health care provider. This is important. Contact a health care provider  if: Your pain gets worse. You have new pain in your arm, hands, or fingers. Your pain is not relieved with medicine or does not get better after 6 weeks of treatment. You have crackling sensations when moving your shoulder in certain directions. You hear a snapping sound after using your shoulder, followed by severe pain and weakness. Get help right away if: Your arm, hand, or fingers are numb or tingling. Your arm, hand, or fingers are swollen or painful or they turn white or blue. Summary Rotator cuff tendinitis is inflammation of the tendons in the rotator cuff. Tendons are tough, cord-like bands that connect muscle to bone. This condition is usually caused by overusing the rotator cuff, which includes all of the muscles and tendons that connect the arm to the shoulder. This condition is more likely to develop in athletes and workers who frequently use their shoulder or reach over their heads. Treatment generally includes rest, anti-inflammatory medicines, and icing. In some cases, physical therapy and steroid injections may be needed. In severe cases, surgery may be needed. This information is not intended to replace advice given to you by your health care provider. Make sure you discuss any questions you have with your health care provider. Document Revised: 07/18/2019 Document Reviewed: 07/18/2019 Elsevier Patient Education  2022 [employer].

## 2022-05-25 IMAGING — CT CT HEART MORP W/ CTA COR W/ SCORE W/ CA W/CM &/OR W/O CM
4 of 7 series · 8 of 20 positions shown, 9 images · IV contrast (APPLIED)
Comparison: None.
COMPARISON: None.

Addendum:
EXAM:
OVER-READ INTERPRETATION  CT CHEST

The following report is an over-read performed by radiologist Dr.
Necolae Mayr [REDACTED] on 02/20/2021. This
over-read does not include interpretation of cardiac or coronary
anatomy or pathology. The coronary calcium score/coronary CTA
interpretation by the cardiologist is attached.
TECHNIQUE: The patient was scanned on a Phillips Force scanner.

[Series 6: best diast 72 % · axial · 0.39mm/px · z∈[+1160,+1203]mm · 2 of 328 slices shown]
[im 110/328  vessel]
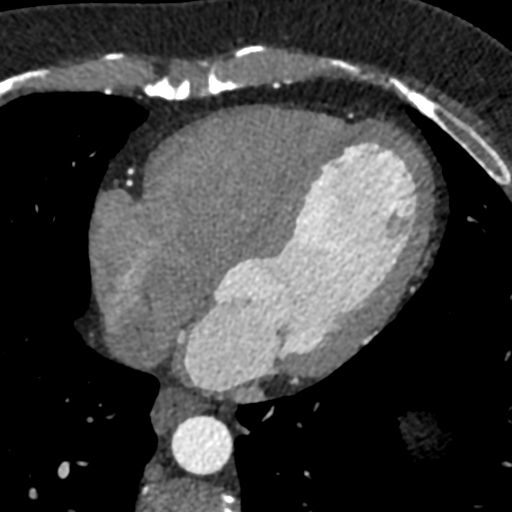
[im 219/328  vessel]
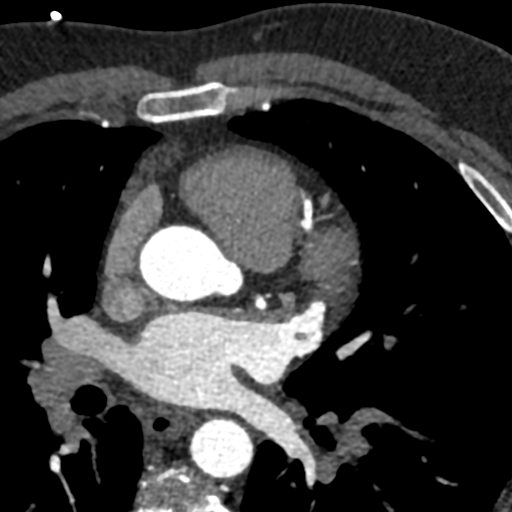

[Series 7: best syst · axial · 0.39mm/px · z∈[+1160,+1203]mm · 2 of 328 slices shown, 3 images]
[im 110/328  vessel]
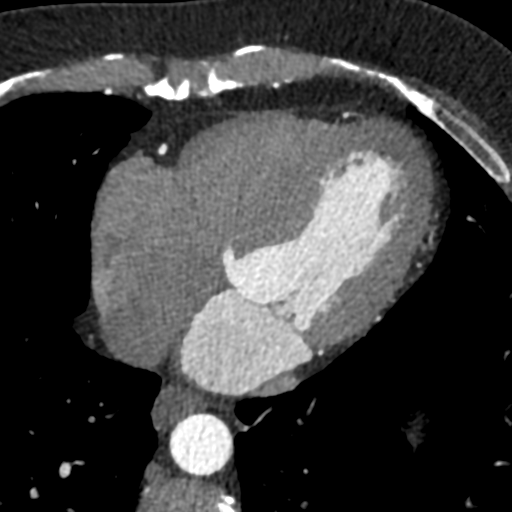
[im 110/328  lung]
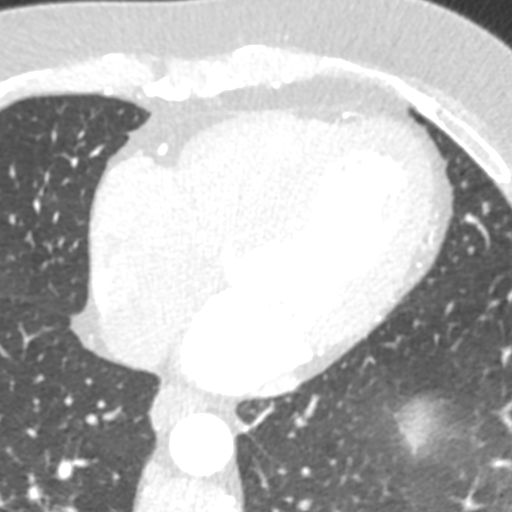
[im 219/328  vessel]
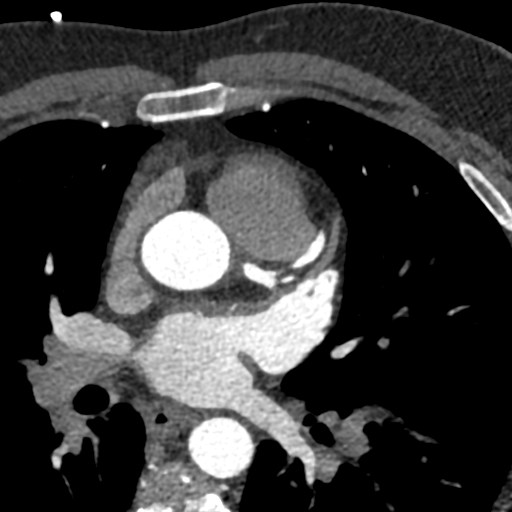

[Series 8: ts diast sharp 72 % · axial · 0.39mm/px · z∈[+1160,+1203]mm · 2 of 328 slices shown]
[im 110/328  lung]
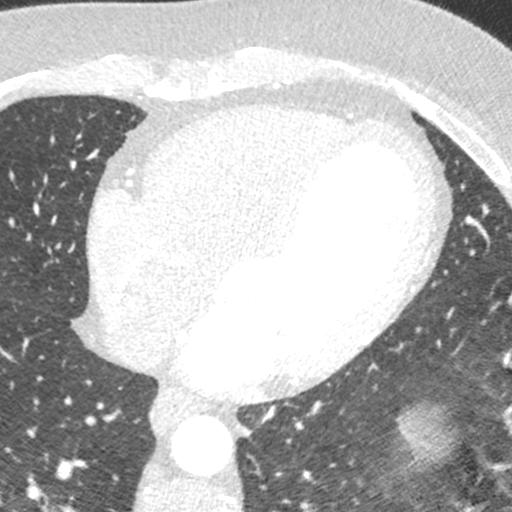
[im 219/328  lung]
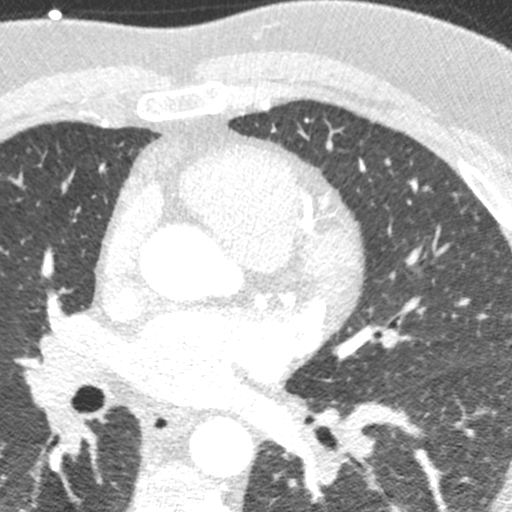

[Series 9: ts syst sharp · axial · 0.39mm/px · z∈[+1160,+1203]mm · 2 of 328 slices shown]
[im 110/328  lung]
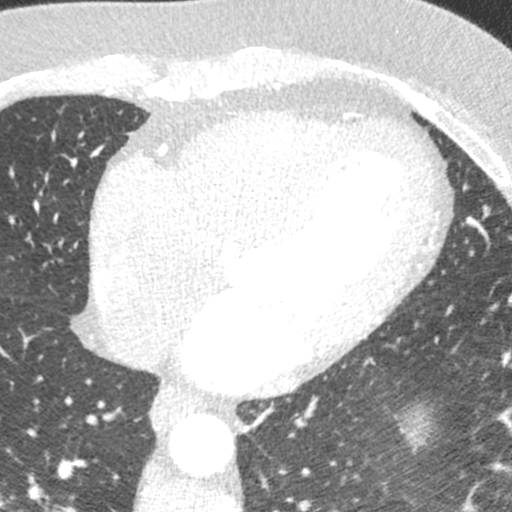
[im 219/328  lung]
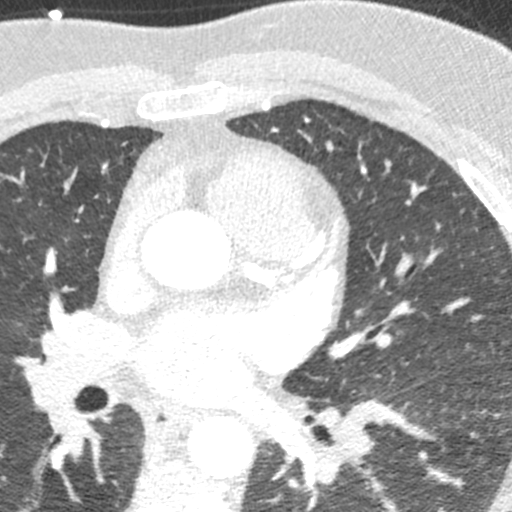

[8 of 20 positions shown; findings below may reference images not displayed]

FINDINGS: Vascular: Heart is at the upper limits of normal in size to mildly
enlarged. No pericardial effusion.

Mediastinum/Nodes: Mediastinal lymph nodes are not enlarged by CT
size criteria.

Lungs/Pleura: Minimal dependent atelectasis. Visualized lungs are
otherwise clear.

Upper Abdomen: Visualized portions of the liver, spleen and stomach
are unremarkable.

Musculoskeletal: Degenerative changes in the spine.
IMPRESSION: No acute extracardiac findings.

EXAM:
Cardiac/Coronary  CT
FINDINGS: A 120 kV prospective scan was triggered in the descending thoracic
aorta at 111 HU's. Axial non-contrast 3 mm slices were carried out
through the heart. The data set was analyzed on a dedicated work
station and scored using the Agatson method. Gantry rotation speed
was 250 msecs and collimation was .6 mm. No beta blockade and 0.8 mg
of sl NTG was given. The 3D data set was reconstructed in 5%
intervals of the 67-82 % of the R-R cycle. Diastolic phases were
analyzed on a dedicated work station using MPR, MIP and VRT modes.
The patient received 80 cc of contrast.

Aorta:  Normal size.  No calcifications.  No dissection.

Aortic Valve:  Trileaflet.  No calcifications.

Coronary Arteries:  Normal coronary origin.  Right dominance.

RCA is a large dominant artery that gives rise to PDA and PLVB.
There is no plaque.

Left main is a large artery that gives rise to LAD, Ramus and LCX
arteries. There is no plaque.

LAD is a large vessel that gives rise to a small D1. There is no
plaque.

Ramus is a small vessel with no plaque.

LCX is a non-dominant artery that gives rise to one large OM1
branch. There is no plaque.

Other findings:

Normal pulmonary vein drainage into the left atrium.

Normal let atrial appendage without a thrombus.

Normal size of the pulmonary artery.
IMPRESSION: 1. Coronary calcium score of 0. This was 0 percentile for age and
sex matched control.

2.  Normal coronary origin with right dominance.

3.  No evidence of CAD.  CAD RADS 0.

4.  Consider non atherosclerotic causes of chest pain.

Mariusz Maniek Damps

*** End of Addendum ***
EXAM:
OVER-READ INTERPRETATION  CT CHEST

The following report is an over-read performed by radiologist Dr.
Necolae Mayr [REDACTED] on 02/20/2021. This
over-read does not include interpretation of cardiac or coronary
anatomy or pathology. The coronary calcium score/coronary CTA
interpretation by the cardiologist is attached.
FINDINGS: Vascular: Heart is at the upper limits of normal in size to mildly
enlarged. No pericardial effusion.

Mediastinum/Nodes: Mediastinal lymph nodes are not enlarged by CT
size criteria.

Lungs/Pleura: Minimal dependent atelectasis. Visualized lungs are
otherwise clear.

Upper Abdomen: Visualized portions of the liver, spleen and stomach
are unremarkable.

Musculoskeletal: Degenerative changes in the spine.
IMPRESSION: No acute extracardiac findings.

## 2022-10-22 ENCOUNTER — Encounter: Payer: Self-pay | Admitting: Family Medicine

## 2022-10-22 ENCOUNTER — Ambulatory Visit: Payer: 59 | Admitting: Family Medicine

## 2022-10-22 VITALS — BP 118/74 | HR 73 | Temp 97.7°F | Ht 69.0 in | Wt 210.6 lb

## 2022-10-22 DIAGNOSIS — K625 Hemorrhage of anus and rectum: Secondary | ICD-10-CM | POA: Diagnosis not present

## 2022-10-22 DIAGNOSIS — K644 Residual hemorrhoidal skin tags: Secondary | ICD-10-CM | POA: Diagnosis not present

## 2022-10-22 LAB — CBC
HCT: 44.4 % (ref 39.0–52.0)
Hemoglobin: 14.9 g/dL (ref 13.0–17.0)
MCHC: 33.6 g/dL (ref 30.0–36.0)
MCV: 90.9 fl (ref 78.0–100.0)
Platelets: 271 10*3/uL (ref 150.0–400.0)
RBC: 4.88 Mil/uL (ref 4.22–5.81)
RDW: 14.7 % (ref 11.5–15.5)
WBC: 10.7 10*3/uL — ABNORMAL HIGH (ref 4.0–10.5)

## 2022-10-22 NOTE — Progress Notes (Signed)
[facility] [address] [place] [address] Dept: [phone] Dept Fax: [phone]  Office Visit  Subjective:    Patient ID: Larry Taylor, male    DOB: [birth year], 51 y.o..   MRN: [id number]  Chief Complaint  Patient presents with   Acute Visit    C/o having blood in stool x 2 weeks.      History of Present Illness:  Patient is in today complaining of a 2-week history of bright red bleeding per rectum. He notes his wife had been sick a few weeks ago with a diarrheal illness and N/V. Soon after, he did have a day of vomiting. He started to experience rectal bleeding with his bowel movements. He notes the bowel movements were a little loose to start with, but have been more normal recently. He notes when he starts to strain with a bowel movement, he will have a stream of bright red blood. He then contracts his rectum to stop this. He has a past history of hemorrhoids. He has noted the bleeding consistently with bowel movements. He states he has been having weight loss and feeling more tired out recently.  Past Medical History: Patient Active Problem List   Diagnosis Date Noted   Bright red rectal bleeding 10/22/2022   Bee sting allergy 04/19/2021   History of drug abuse in remission (HCC) 11/30/2020   Fatigue 04/20/2017   Tobacco use disorder, continuous 04/20/2017   Tobacco abuse counseling 04/20/2017   Incomplete right bundle branch block 11/03/2016   History reviewed. No pertinent surgical history.  Family History  Problem Relation Age of Onset   Cancer Mother    Hyperlipidemia Mother    Cancer Father    Heart disease Father    Stroke Father    High Cholesterol Father    Cancer Sister    Cancer Brother    Outpatient Medications Prior to Visit  Medication Sig Dispense Refill   Ascorbic Acid (VITAMIN C) 1000 MG tablet Take 1,000 mg by mouth daily.     EPINEPHrine 0.3 mg/0.3 mL IJ SOAJ injection Inject 0.3  mg into the muscle as needed for anaphylaxis. 1 each 3   ibuprofen (ADVIL) 100 MG tablet Take 100 mg by mouth every 6 (six) hours as needed for fever.     loratadine-pseudoephedrine (CLARITIN-D 12-HOUR) 5-120 MG tablet Take 1 tablet by mouth 2 (two) times daily.     omeprazole (PRILOSEC) 20 MG capsule Take 20 mg by mouth daily.     Omega-3 Fatty Acids (FISH OIL) 1000 MG CAPS Take by mouth. (Patient not taking: Reported on 10/22/2022)     meloxicam (MOBIC) 7.5 MG tablet Take 1 tablet (7.5 mg total) by mouth daily. 14 tablet 1   No facility-administered medications prior to visit.   No Known Allergies    Objective:   Today's Vitals   10/22/22 1409  BP: 118/74  Pulse: 73  Temp: 97.7 F (36.5 C)  TempSrc: Temporal  SpO2: 95%  Weight: 210 lb 9.6 oz (95.5 kg)  Height: 5\' 9"  (1.753 m)   Body mass index is 31.1 kg/m.   General: Well developed, well nourished. No acute distress. Rectum: There are several external hemorrhoids present. None acre actively bleeding. There rectum   appears intact. There is no palpable rectal masses. Psych: Alert and oriented. Normal mood and affect.  Health Maintenance Due  Topic Date Due   Hepatitis C Screening  Never done   DTaP/Tdap/Td (1 - Tdap) Never  done   Zoster Vaccines- Shingrix (1 of 2) Never done   COLONOSCOPY (Pts 45-68yrs Insurance coverage will need to be confirmed)  Never done   Lung Cancer Screening  02/20/2022     Assessment & Plan:   1. Bright red rectal bleeding In light of the patient seeing recurrent BRBPR, I will refer him for colonoscopy to look for potential sources of this. I will check a CBC to rule out anemia. Were he to experience heavy bleeding and lightheadedness, he should consider seeking emergency care.  - CBC - Ambulatory referral to Gastroenterology   Return for Annual preventative care.   Loyola Mast, MD

## 2022-10-28 ENCOUNTER — Encounter: Payer: Self-pay | Admitting: Physician Assistant

## 2022-11-10 ENCOUNTER — Ambulatory Visit (INDEPENDENT_AMBULATORY_CARE_PROVIDER_SITE_OTHER): Payer: 59 | Admitting: Physician Assistant

## 2022-11-10 ENCOUNTER — Encounter: Payer: Self-pay | Admitting: Physician Assistant

## 2022-11-10 VITALS — BP 124/82 | HR 65 | Ht 69.0 in | Wt 213.0 lb

## 2022-11-10 DIAGNOSIS — Z1211 Encounter for screening for malignant neoplasm of colon: Secondary | ICD-10-CM

## 2022-11-10 DIAGNOSIS — K625 Hemorrhage of anus and rectum: Secondary | ICD-10-CM

## 2022-11-10 MED ORDER — NA SULFATE-K SULFATE-MG SULF 17.5-3.13-1.6 GM/177ML PO SOLN: ORAL | 0 refills | Status: DC

## 2022-11-10 NOTE — Patient Instructions (Signed)
You have been scheduled for a colonoscopy. Please follow written instructions given to you at your visit today.  Please pick up your prep supplies at the pharmacy within the next 1-3 days. If you use inhalers (even only as needed), please bring them with you on the day of your procedure.  _______________________________________________________  If your blood pressure at your visit was 140/90 or greater, please contact your primary care physician to follow up on this.  _______________________________________________________  If you are age 52 or older, your body mass index should be between 23-30. Your Body mass index is 31.45 kg/m. If this is out of the aforementioned range listed, please consider follow up with your Primary Care Provider.  If you are age 101 or younger, your body mass index should be between 19-25. Your Body mass index is 31.45 kg/m. If this is out of the aformentioned range listed, please consider follow up with your Primary Care Provider.   ________________________________________________________  The [facility] providers would like to encourage you to use [facility] to communicate with providers for non-urgent requests or questions.  Due to long hold times on the telephone, sending your provider a message by [facility] may be a faster and more efficient way to get a response.  Please allow 48 business hours for a response.  Please remember that this is for non-urgent requests.  _______________________________________________________  Due to recent changes in healthcare laws, you may see the results of your imaging and laboratory studies on MyChart before your provider has had a chance to review them.  We understand that in some cases there may be results that are confusing or concerning to you. Not all laboratory results come back in the same time frame and the provider may be waiting for multiple results in order to interpret others.  Please give [place] 48 hours in order for your  provider to thoroughly review all the results before contacting the office for clarification of your results.

## 2022-11-10 NOTE — Progress Notes (Signed)
____________________________________________________________  Attending physician addendum:  Thank you for sending this case to me. I have reviewed the entire note and agree with the plan.   Merrily Tegeler Danis, MD  ____________________________________________________________  

## 2022-11-10 NOTE — Progress Notes (Signed)
Subjective:    Patient ID: Larry Taylor, male    DOB: Apr 05, 1971, 52 y.o.   MRN: [id number]  HPI  Larry Taylor is a pleasant 52 year old white male, new to GI today referred by Dr. Arlester Marker to discuss colonoscopy in setting of recent rectal bleeding. Patient has not had any prior GI history. He says that he has known that he has had external hemorrhoids and has had occasional "spot" bleeding in the past but nothing like what he had in December.  He says that he had an episode of bleeding that lasted for about 10 days noting bright red blood with bowel movements and overt "streaming of blood" into the commode with bowel movements, sometimes without stool.  The last episode was on 10/21/2022, and has not recurred since. Prior to onset of this episode he wondered if he may have had food poisoning, had developed some nausea and then diarrhea noting bright red blood for a day or 2 then after that just started seeing the bright red blood in squirts of bright red blood into the commode while bowel movements gradually became more normal. He has not had any rectal pain or discomfort, no complaints of abdominal pain or discomfort, no changes in bowel movements.  No regular aspirin or NSAIDs.  He feels fine currently. No family history of colon cancer or polyps that he is aware of. He does have history of what he was told was silent reflux and uses 20 mg of omeprazole intermittently.  No ongoing heartburn or indigestion and no dysphagia or odynophagia.  Otherwise healthy with exception of incomplete right bundle branch block.  Labs on 10/22/2022 WBC of 10.7 hemoglobin 14.9 Rectal exam per PCP with several external hemorrhoids noted no palpable rectal mass.  Review of Systems Pertinent positive and negative review of systems were noted in the above HPI section.  All other review of systems was otherwise negative.   Outpatient Encounter Medications as of 11/10/2022  Medication Sig   EPINEPHrine 0.3  mg/0.3 mL IJ SOAJ injection Inject 0.3 mg into the muscle as needed for anaphylaxis.   loratadine-pseudoephedrine (CLARITIN-D 12-HOUR) 5-120 MG tablet Take 1 tablet by mouth 2 (two) times daily.   Na Sulfate-K Sulfate-Mg Sulf 17.5-3.13-1.6 GM/177ML SOLN Use as directed; may use generic; goodrx card if insurance will not cover generic   omeprazole (PRILOSEC) 20 MG capsule Take 20 mg by mouth daily.   Ascorbic Acid (VITAMIN C) 1000 MG tablet Take 1,000 mg by mouth daily.   ibuprofen (ADVIL) 100 MG tablet Take 100 mg by mouth every 6 (six) hours as needed for fever.   Omega-3 Fatty Acids (FISH OIL) 1000 MG CAPS Take by mouth. (Patient not taking: Reported on 10/22/2022)   No facility-administered encounter medications on file as of 11/10/2022.   No Known Allergies Patient Active Problem List   Diagnosis Date Noted   Bright red rectal bleeding 10/22/2022   External hemorrhoids 10/22/2022   Bee sting allergy 04/19/2021   History of drug abuse in remission ([place]) 11/30/2020   Fatigue 04/20/2017   Tobacco use disorder, continuous 04/20/2017   Tobacco abuse counseling 04/20/2017   Incomplete right bundle branch block 11/03/2016   Social History   Socioeconomic History   Marital status: Married    Spouse name: Not on file   Number of children: 2   Years of education: Not on file   Highest education level: Not on file  Occupational History   Occupation: Driver    Comment: [employer]  Occupation: pest control  Tobacco Use   Smoking status: Former    Packs/day: 1.00    Years: 30.00    Total pack years: 30.00    Types: Cigarettes    Quit date: 2021    Years since quitting: 3.0   Smokeless tobacco: Never  Vaping Use   Vaping Use: Never used  Substance and Sexual Activity   Alcohol use: No    Comment: Past abuse of beer (1 case a day) quit in 2009.   Drug use: Not Currently    Types: "Crack" cocaine    Comment: Opioids- Quit after rehab in 2009.   Sexual activity: Yes  Other Topics  Concern   Not on file  Social History Narrative   Not on file   Social Determinants of Health   Financial Resource Strain: Not on file  Food Insecurity: Not on file  Transportation Needs: Not on file  Physical Activity: Not on file  Stress: Not on file  Social Connections: Not on file  Intimate Partner Violence: Not on file    Mr. Hausmann's family history includes Cancer in his brother, father, mother, and sister; Heart disease in his father; High Cholesterol in his father; Hyperlipidemia in his mother; Stroke in his father.      Objective:    Vitals:   11/10/22 1117  BP: 124/82  Pulse: 65    Physical Exam Well-developed well-nourished WM  in no acute distress.  Height, Weight, 213 BMI 31.4  HEENT; nontraumatic normocephalic, EOMI, PE R LA, sclera anicteric. Oropharynx;not examined today  Neck; supple, no JVD Cardiovascular; regular rate and rhythm with S1-S2, no murmur rub or gallop Pulmonary; Clear bilaterally Abdomen; soft, nontender, nondistended, no palpable mass or hepatosplenomegaly, bowel sounds are active Rectal;not done today  Skin; benign exam, no jaundice rash or appreciable lesions Extremities; no clubbing cyanosis or edema skin warm and dry Neuro/Psych; alert and oriented x4, grossly nonfocal mood and affect appropriate        Assessment & Plan:   #29 52 year old white male with recent episode of hematochezia which lasted for about 10 days, describing bright red blood "streaming" into the commode with bowel movements.  Initial onset after possible infectious gastroenteritis with nausea diarrhea and bright red blood.  Symptoms resolved 10/21/2022 and have not recurred By history bleeding sounds consistent with bleeding from the internal hemorrhoid  No prior colonoscopy.  Will need to rule out neoplasm, proctitis/IBD  #2 incomplete right bundle branch block #3 remote history of substance abuse in remission since 2009 #4 External hemorrhoids  Plan;  patient will be scheduled for colonoscopy with Dr. Loletha Carrow.  (Patient had specifically requested a Monday procedure) Procedure was discussed in detail with the patient including indications risks and benefits and he is agreeable to proceed. Further recommendations pending findings at colonoscopy.  We did briefly discuss in office hemorrhoidal banding should his bleeding be found secondary to internal hemorrhoids.  Hudson Lehmkuhl Genia Harold PA-C 11/10/2022   Cc: Haydee Salter, MD

## 2022-11-11 ENCOUNTER — Encounter: Payer: Self-pay | Admitting: Gastroenterology

## 2022-11-14 ENCOUNTER — Telehealth: Payer: Self-pay | Admitting: Gastroenterology

## 2022-11-14 NOTE — Telephone Encounter (Signed)
Good morning Dr. Loletha Carrow,  Inbound call from patient, he stated he got a message to confirm his procedure for 1/23. However, patient thought his procedure was on 1/29. He called and said he had requested his day off from work already and had no more vacation days so he wanted to see if we could reschedule for 1/29. No appointments were available that day so patient rescheduled for the end of March. Patient would like to see if fee can be waived.   Thank you.

## 2022-11-17 NOTE — Telephone Encounter (Signed)
Called and spoke with patient to offer him an appt on 11/24/22. Pt states that he prefers to keep his March appt as scheduled. Pt had no other concerns at the end of the call.

## 2022-11-17 NOTE — Telephone Encounter (Signed)
[place],  Please see message from front office regarding this patient's procedure.  I could start early and do a 7:30 AM case on 11/24/2022 if this patient still has that day off from work.  If so, he would need instructions on timing of bowel prep. Please let me know one way or the other so I can plan accordingly.  If he is unable to do that and remains on the March date, I will waive the cancellation fee.  Sounds like it was a simple mistake.   - HD

## 2022-11-18 ENCOUNTER — Encounter: Payer: 59 | Admitting: Gastroenterology

## 2022-12-16 ENCOUNTER — Ambulatory Visit (AMBULATORY_SURGERY_CENTER): Payer: 59

## 2022-12-16 VITALS — Ht 69.0 in | Wt 215.0 lb

## 2022-12-16 DIAGNOSIS — K625 Hemorrhage of anus and rectum: Secondary | ICD-10-CM

## 2022-12-16 DIAGNOSIS — Z1211 Encounter for screening for malignant neoplasm of colon: Secondary | ICD-10-CM

## 2022-12-16 MED ORDER — NA SULFATE-K SULFATE-MG SULF 17.5-3.13-1.6 GM/177ML PO SOLN: 1.0000 | Freq: Once | ORAL | 0 refills | Status: AC

## 2022-12-16 NOTE — Progress Notes (Signed)

## 2023-01-01 ENCOUNTER — Encounter: Payer: Self-pay | Admitting: Gastroenterology

## 2023-01-18 ENCOUNTER — Encounter: Payer: Self-pay | Admitting: Certified Registered Nurse Anesthetist

## 2023-01-19 ENCOUNTER — Ambulatory Visit (AMBULATORY_SURGERY_CENTER): Payer: 59 | Admitting: Gastroenterology

## 2023-01-19 ENCOUNTER — Encounter: Payer: Self-pay | Admitting: Gastroenterology

## 2023-01-19 VITALS — BP 106/74 | HR 51 | Temp 98.9°F | Resp 12 | Ht 69.0 in | Wt 215.0 lb

## 2023-01-19 DIAGNOSIS — Z1211 Encounter for screening for malignant neoplasm of colon: Secondary | ICD-10-CM

## 2023-01-19 DIAGNOSIS — K635 Polyp of colon: Secondary | ICD-10-CM

## 2023-01-19 DIAGNOSIS — D122 Benign neoplasm of ascending colon: Secondary | ICD-10-CM | POA: Diagnosis not present

## 2023-01-19 DIAGNOSIS — D123 Benign neoplasm of transverse colon: Secondary | ICD-10-CM

## 2023-01-19 DIAGNOSIS — D120 Benign neoplasm of cecum: Secondary | ICD-10-CM | POA: Diagnosis not present

## 2023-01-19 DIAGNOSIS — D125 Benign neoplasm of sigmoid colon: Secondary | ICD-10-CM | POA: Diagnosis not present

## 2023-01-19 MED ORDER — SODIUM CHLORIDE 0.9 % IV SOLN: 500.0000 mL | Freq: Once | INTRAVENOUS | Status: DC

## 2023-01-19 NOTE — Patient Instructions (Signed)
Await pathology results.  Handouts on polyps, diverticulosis, and hemorrhoids provided.  YOU HAD AN ENDOSCOPIC PROCEDURE TODAY AT THE  ENDOSCOPY CENTER:   Refer to the procedure report that was given to you for any specific questions about what was found during the examination.  If the procedure report does not answer your questions, please call your gastroenterologist to clarify.  If you requested that your care partner not be given the details of your procedure findings, then the procedure report has been included in a sealed envelope for you to review at your convenience later.  YOU SHOULD EXPECT: Some feelings of bloating in the abdomen. Passage of more gas than usual.  Walking can help get rid of the air that was put into your GI tract during the procedure and reduce the bloating. If you had a lower endoscopy (such as a colonoscopy or flexible sigmoidoscopy) you may notice spotting of blood in your stool or on the toilet paper. If you underwent a bowel prep for your procedure, you may not have a normal bowel movement for a few days.  Please Note:  You might notice some irritation and congestion in your nose or some drainage.  This is from the oxygen used during your procedure.  There is no need for concern and it should clear up in a day or so.  SYMPTOMS TO REPORT IMMEDIATELY:  Following lower endoscopy (colonoscopy or flexible sigmoidoscopy):  Excessive amounts of blood in the stool  Significant tenderness or worsening of abdominal pains  Swelling of the abdomen that is new, acute  Fever of 100F or higher   For urgent or emergent issues, a gastroenterologist can be reached at any hour by calling [phone]. Do not use MyChart messaging for urgent concerns.    DIET:  We do recommend a small meal at first, but then you may proceed to your regular diet.  Drink plenty of fluids but you should avoid alcoholic beverages for 24 hours.  ACTIVITY:  You should plan to take it easy  for the rest of today and you should NOT DRIVE or use heavy machinery until tomorrow (because of the sedation medicines used during the test).    FOLLOW UP: Our staff will call the number listed on your records the next business day following your procedure.  We will call around 7:15- 8:00 am to check on you and address any questions or concerns that you may have regarding the information given to you following your procedure. If we do not reach you, we will leave a message.     If any biopsies were taken you will be contacted by phone or by letter within the next 1-3 weeks.  Please call us at [phone] if you have not heard about the biopsies in 3 weeks.    SIGNATURES/CONFIDENTIALITY: You and/or your care partner have signed paperwork which will be entered into your electronic medical record.  These signatures attest to the fact that that the information above on your After Visit Summary has been reviewed and is understood.  Full responsibility of the confidentiality of this discharge information lies with you and/or your care-partner.  

## 2023-01-19 NOTE — Progress Notes (Signed)
Report given to PACU, vss 

## 2023-01-19 NOTE — Op Note (Signed)
[facility] Patient Name: Larry Taylor Procedure Date: 01/19/2023 9:45 AM MRN: [id number] Endoscopist: Mallie Mussel L. Loletha Carrow , MD, [id number] Age: 52 Referring MD:  Date of Birth: 19-Jul-1971 Gender: Male Account #: [id number] Procedure:                Colonoscopy Indications:              Screening for colorectal malignant neoplasm, This                            is the patient's first colonoscopy Medicines:                Monitored Anesthesia Care Procedure:                Pre-Anesthesia Assessment:                           - Prior to the procedure, a History and Physical                            was performed, and patient medications and                            allergies were reviewed. The patient's tolerance of                            previous anesthesia was also reviewed. The risks                            and benefits of the procedure and the sedation                            options and risks were discussed with the patient.                            All questions were answered, and informed consent                            was obtained. Prior Anticoagulants: The patient has                            taken no anticoagulant or antiplatelet agents. ASA                            Grade Assessment: II - A patient with mild systemic                            disease. After reviewing the risks and benefits,                            the patient was deemed in satisfactory condition to                            undergo the procedure.  After obtaining informed consent, the colonoscope                            was passed under direct vision. Throughout the                            procedure, the patient's blood pressure, pulse, and                            oxygen saturations were monitored continuously. The                            Olympus CF-[id number] SN [id number] was introduced through                            the anus and advanced  to the the cecum, identified                            by appendiceal orifice and ileocecal valve. The                            colonoscopy was performed without difficulty. The                            patient tolerated the procedure well. The quality                            of the bowel preparation was good. The ileocecal                            valve, appendiceal orifice, and rectum were                            photographed. Scope In: 9:52:33 AM Scope Out: 10:08:49 AM Scope Withdrawal Time: 0 hours 13 minutes 57 seconds  Total Procedure Duration: 0 hours 16 minutes 16 seconds  Findings:                 Internal hemorrhoids were found on perianal                            exam.(photo)                           Repeat examination of right colon under NBI                            performed.                           A diminutive polyp was found in the cecum. The                            polyp was semi-sessile. The polyp was removed with  a cold biopsy forceps. Resection and retrieval were                            complete.                           Two sessile polyps were found in the transverse                            colon and ascending colon. The polyps were 6-8 mm                            in size. These polyps were removed with a cold                            snare. Resection and retrieval were complete.                           Multiple diverticula were found in the left colon.                           A 4 mm polyp was found in the sigmoid colon. The                            polyp was semi-pedunculated. The polyp was removed                            with a cold snare. Resection and retrieval were                            complete.                           Internal hemorrhoids were found. The hemorrhoids                            were Grade II (internal hemorrhoids that prolapse                            but reduce  spontaneously). There was stigmata of                            bleeding from bowel preparation.                           The exam was otherwise without abnormality on                            direct and retroflexion views. Complications:            No immediate complications. Estimated Blood Loss:     Estimated blood loss was minimal. Impression:               - Hemorrhoids found on perianal exam.                           -  One diminutive polyp in the cecum, removed with a                            cold biopsy forceps. Resected and retrieved.                           - Two 6-8 mm polyps in the transverse colon and in                            the ascending colon, removed with a cold snare.                            Resected and retrieved.                           - Diverticulosis in the left colon.                           - One 4 mm polyp in the sigmoid colon, removed with                            a cold snare. Resected and retrieved.                           - Internal hemorrhoids.                           - The examination was otherwise normal on direct                            and retroflexion views. Recommendation:           - Patient has a contact number available for                            emergencies. The signs and symptoms of potential                            delayed complications were discussed with the                            patient. Return to normal activities tomorrow.                            Written discharge instructions were provided to the                            patient.                           - Resume previous diet.                           - Continue present medications.                           -  Await pathology results.                           - Repeat colonoscopy is recommended for                            surveillance. The colonoscopy date will be                            determined after pathology results from today's                             exam become available for review. Izabella Marcantel L. Loletha Carrow, MD 01/19/2023 10:14:34 AM This report has been signed electronically.

## 2023-01-19 NOTE — Progress Notes (Signed)
Called to room to assist during endoscopic procedure.  Patient ID and intended procedure confirmed with present staff. Received instructions for my participation in the procedure from the performing physician.  

## 2023-01-19 NOTE — Progress Notes (Signed)
History and Physical:  This patient presents for endoscopic testing for: Encounter Diagnosis  Name Primary?   Special screening for malignant neoplasms, colon Yes   Clinical details in 11/10/22 office consult note. Had a single episode of rectal bleeding Dec 2023  Patient is otherwise without complaints or active issues today.   Past Medical History: Past Medical History:  Diagnosis Date   Alcoholism ([place])    Allergy    Heart murmur    per patient diagnosed at 52 years old     Past Surgical History: History reviewed. No pertinent surgical history.  Allergies: No Known Allergies  Outpatient Meds: Current Outpatient Medications  Medication Sig Dispense Refill   loratadine-pseudoephedrine (CLARITIN-D 12-HOUR) 5-120 MG tablet Take 1 tablet by mouth 2 (two) times daily.     omeprazole (PRILOSEC) 20 MG capsule Take 20 mg by mouth daily.     EPINEPHrine 0.3 mg/0.3 mL IJ SOAJ injection Inject 0.3 mg into the muscle as needed for anaphylaxis. (Patient not taking: Reported on 12/16/2022) 1 each 3   Current Facility-Administered Medications  Medication Dose Route Frequency Provider Last Rate Last Admin   0.9 %  sodium chloride infusion  500 mL Intravenous Once Danis, Kirke Corin, MD          ___________________________________________________________________ Objective   Exam:  BP (!) 157/84   Pulse 63   Temp 98.9 F (37.2 C) (Temporal)   Ht 5\' 9"  (1.753 m)   Wt 215 lb (97.5 kg)   SpO2 97%   BMI 31.75 kg/m   CV: regular , S1/S2 Resp: clear to auscultation bilaterally, normal RR and effort noted GI: soft, no tenderness, with active bowel sounds.   Assessment: Encounter Diagnosis  Name Primary?   Special screening for malignant neoplasms, colon Yes     Plan: Colonoscopy  The benefits and risks of the planned procedure were described in detail with the patient or (when appropriate) their health care proxy.  Risks were outlined as including, but not limited to,  bleeding, infection, perforation, adverse medication reaction leading to cardiac or pulmonary decompensation, pancreatitis (if ERCP).  The limitation of incomplete mucosal visualization was also discussed.  No guarantees or warranties were given.    The patient is appropriate for an endoscopic procedure in the ambulatory setting.   - Wilfrid Lund, MD

## 2023-01-20 ENCOUNTER — Telehealth: Payer: Self-pay

## 2023-01-20 NOTE — Telephone Encounter (Signed)
Post procedure follow up call, no answer 

## 2023-01-27 ENCOUNTER — Encounter: Payer: Self-pay | Admitting: Gastroenterology

## 2023-08-31 ENCOUNTER — Encounter: Payer: Self-pay | Admitting: Family Medicine

## 2023-08-31 ENCOUNTER — Ambulatory Visit (INDEPENDENT_AMBULATORY_CARE_PROVIDER_SITE_OTHER): Payer: 59 | Admitting: Family Medicine

## 2023-08-31 VITALS — BP 122/76 | HR 69 | Temp 97.9°F | Ht 69.0 in | Wt 216.8 lb

## 2023-08-31 DIAGNOSIS — Z125 Encounter for screening for malignant neoplasm of prostate: Secondary | ICD-10-CM

## 2023-08-31 DIAGNOSIS — Z0000 Encounter for general adult medical examination without abnormal findings: Secondary | ICD-10-CM | POA: Diagnosis not present

## 2023-08-31 DIAGNOSIS — M150 Primary generalized (osteo)arthritis: Secondary | ICD-10-CM | POA: Diagnosis not present

## 2023-08-31 DIAGNOSIS — Z860100 Personal history of colon polyps, unspecified: Secondary | ICD-10-CM | POA: Insufficient documentation

## 2023-08-31 DIAGNOSIS — Z13220 Encounter for screening for lipoid disorders: Secondary | ICD-10-CM

## 2023-08-31 LAB — LIPID PANEL
Cholesterol: 212 mg/dL — ABNORMAL HIGH (ref 0–200)
HDL: 50 mg/dL (ref >39.00)
LDL Cholesterol: 140 mg/dL — ABNORMAL HIGH (ref 0–99)
NonHDL: 162.36
Total CHOL/HDL Ratio: 4
Triglycerides: 113 mg/dL (ref 0.0–149.0)
VLDL: 22.6 mg/dL (ref 0.0–40.0)

## 2023-08-31 LAB — PSA: PSA: 3.27 ng/mL (ref 0.10–4.00)

## 2023-08-31 NOTE — Assessment & Plan Note (Signed)
Discussed management of OA. He prefers natural approaches. I recommended he consider taking turmeric. He should stay active. He could use an NSAID if needed.

## 2023-08-31 NOTE — Progress Notes (Signed)
[facility] [address] [place] [address] Dept: [phone] Dept Fax: [phone]  Annual Physical Visit  Subjective:    Patient ID: Larry Taylor, male    DOB: 03-02-1971, 52 y.o..   MRN: [id number]  Chief Complaint  Patient presents with   Annual Exam    CPE/labs.   Fasting today.  No concerns.    History of Present Illness:  Patient is in today for an annual physical/preventative visit.He notes he has had a stressful time over the past 6 months. His wife has stage IV vulvar cancer and has undergone radiation and chemotherapy. He is relieved she is nearing the end of her treatments.  Review of Systems  Constitutional:  Negative for chills, diaphoresis, fever, malaise/fatigue and weight loss.  HENT:  Negative for congestion, ear pain, hearing loss, sinus pain, sore throat and tinnitus.   Eyes:  Positive for blurred vision. Negative for pain, discharge and redness.       Notes he needs to use reading glasses now.  Respiratory:  Negative for cough, shortness of breath and wheezing.   Cardiovascular:  Negative for chest pain and palpitations.  Gastrointestinal:  Negative for abdominal pain, constipation, diarrhea, heartburn, nausea and vomiting.  Musculoskeletal:  Positive for joint pain. Negative for back pain and myalgias.       Has diffuse joint achiness in most major joints. He has not noted any swelling of joints.  Skin:  Negative for itching and rash.  Psychiatric/Behavioral:  Negative for depression. The patient is not nervous/anxious.    Past Medical History: Patient Active Problem List   Diagnosis Date Noted   History of colon polyps 08/31/2023   External hemorrhoids 10/22/2022   Bee sting allergy 04/19/2021   Fatigue 04/20/2017   Incomplete right bundle branch block 11/03/2016   History reviewed. No pertinent surgical history. Family History  Problem Relation Age of Onset   Cancer Mother     Hyperlipidemia Mother    Cancer Father    Heart disease Father    Stroke Father    High Cholesterol Father    Cancer Sister    Cancer Brother    Colon cancer Neg Hx    Stomach cancer Neg Hx    Esophageal cancer Neg Hx    Colon polyps Neg Hx    Rectal cancer Neg Hx    Outpatient Medications Prior to Visit  Medication Sig Dispense Refill   EPINEPHrine 0.3 mg/0.3 mL IJ SOAJ injection Inject 0.3 mg into the muscle as needed for anaphylaxis. 1 each 3   loratadine-pseudoephedrine (CLARITIN-D 12-HOUR) 5-120 MG tablet Take 1 tablet by mouth 2 (two) times daily.     omeprazole (PRILOSEC) 20 MG capsule Take 20 mg by mouth daily.     No facility-administered medications prior to visit.   No Known Allergies Objective:   Today's Vitals   08/31/23 0815  BP: 122/76  Pulse: 69  Temp: 97.9 F (36.6 C)  TempSrc: Temporal  SpO2: 99%  Weight: 216 lb 12.8 oz (98.3 kg)  Height: 5\' 9"  (1.753 m)   Body mass index is 32.02 kg/m.   General: Well developed, well nourished. No acute distress. HEENT: Normocephalic, non-traumatic. PERRL, EOMI. Conjunctiva clear. External ears normal. EAC and TMs normal   bilaterally. Nose clear without congestion or rhinorrhea. Mucous membranes moist. Oropharynx clear. Good dentition. Neck: Supple. No lymphadenopathy. No thyromegaly. Lungs: Clear to auscultation bilaterally. No wheezing, rales or rhonchi. CV: RRR without murmurs or rubs. Pulses  2+ bilaterally. Abdomen: Soft, non-tender. Bowel sounds positive, normal pitch and frequency. No hepatosplenomegaly. No rebound   or guarding. Extremities: Full ROM. No joint swelling or tenderness. No edema noted. Skin: Warm and dry. No rashes. Psych: Alert and oriented. Normal mood and affect.  Health Maintenance Due  Topic Date Due   Hepatitis C Screening  Never done   DTaP/Tdap/Td (1 - Tdap) Never done   Zoster Vaccines- Shingrix (1 of 2) Never done   Lung Cancer Screening  02/20/2022     Assessment & Plan:    Problem List Items Addressed This Visit       Musculoskeletal and Integument   Primary osteoarthritis involving multiple joints    Discussed management of OA. He prefers natural approaches. I recommended he consider taking turmeric. He should stay active. He could use an NSAID if needed.      Other Visit Diagnoses     Annual physical exam    -  Primary   Overall fair health. Discussed recommended screenings and immunizations.   Screening for prostate cancer       Relevant Orders   PSA   Screening for lipid disorders       Relevant Orders   Lipid panel       Return in about 1 year (around 08/30/2024) for Reassessment.   Loyola Mast, MD

## 2023-10-13 ENCOUNTER — Telehealth: Payer: Self-pay

## 2023-10-13 NOTE — Telephone Encounter (Signed)
Called patient and informed him that we did fax his physical work for to [phone] attn: Verita Schneiders. Dm/cma
